# Patient Record
Sex: Male | Born: 1985 | Race: White | Hispanic: No | Marital: Single | Smoking: Former smoker
Health system: Southern US, Community
[De-identification: ages and names within clinical notes are randomized; demographics above are authoritative.]

## PROBLEM LIST (undated history)

## (undated) DIAGNOSIS — K219 Gastro-esophageal reflux disease without esophagitis: Secondary | ICD-10-CM

## (undated) DIAGNOSIS — F32A Depression, unspecified: Secondary | ICD-10-CM

## (undated) HISTORY — PX: ESOPHAGOGASTRODUODENOSCOPY: SHX1529

## (undated) HISTORY — PX: VASECTOMY: SHX75

## (undated) HISTORY — DX: Depression, unspecified: F32.A

## (undated) HISTORY — DX: Gastro-esophageal reflux disease without esophagitis: K21.9

---

## 2006-01-18 ENCOUNTER — Ambulatory Visit: Payer: Self-pay | Admitting: Specialist

## 2006-10-07 ENCOUNTER — Emergency Department: Payer: Self-pay | Admitting: General Practice
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
## 2009-05-13 ENCOUNTER — Emergency Department: Payer: Self-pay | Admitting: Emergency Medicine
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
## 2011-09-08 ENCOUNTER — Ambulatory Visit: Payer: Self-pay | Admitting: Urology

## 2011-09-08 IMAGING — US US RENAL KIDNEY
1 series · 17 of 25 positions shown · non-contrast
Comparison: none

REASON FOR EXAM: back pain
COMMENTS:

[Series 1: us renal kidney · 17 of 34 slices shown]
[im 1/34]
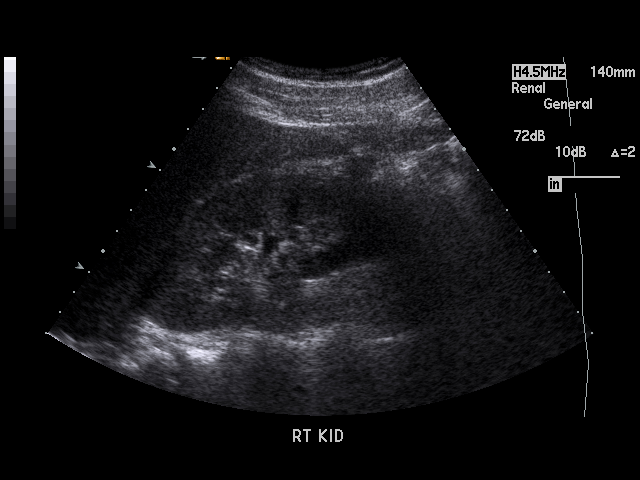
[im 3/34]
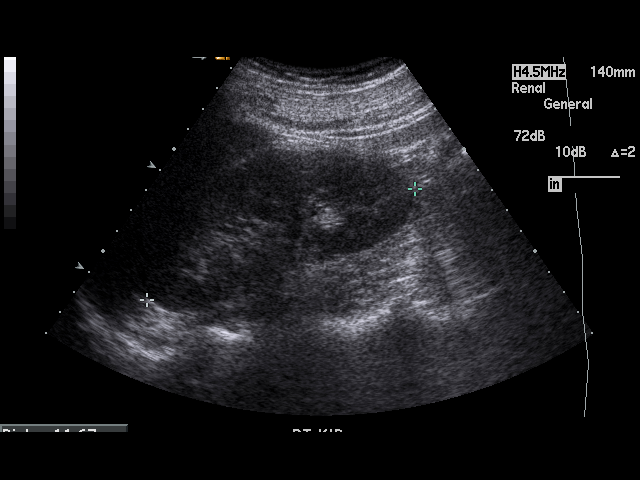
[im 5/34]
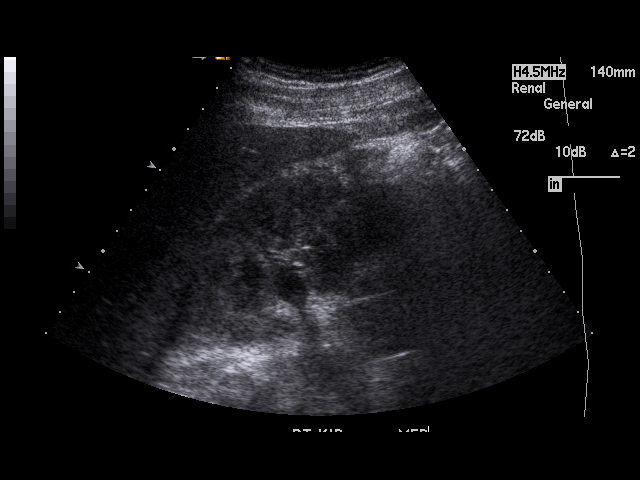
[im 7/34]
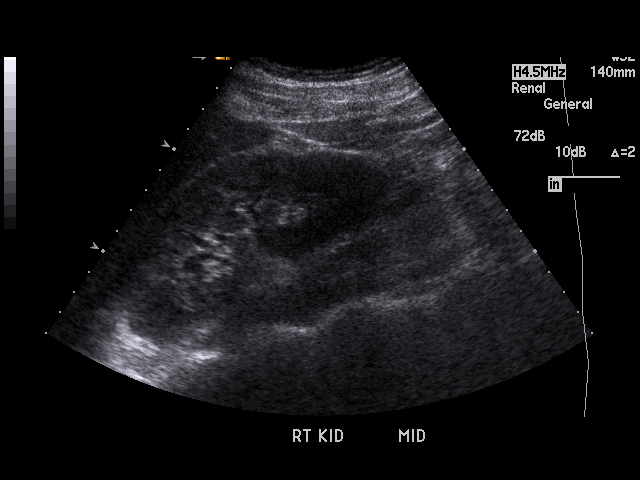
[im 9/34]
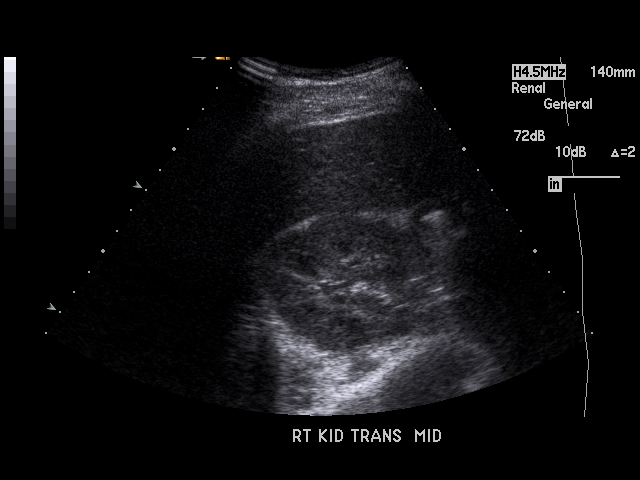
[im 12/34]
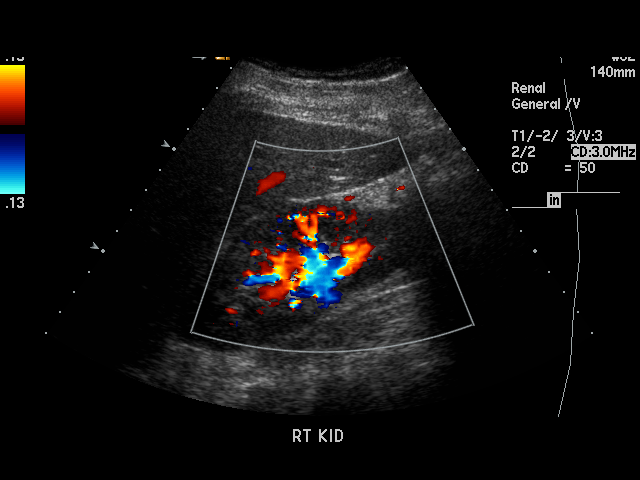
[im 13/34]
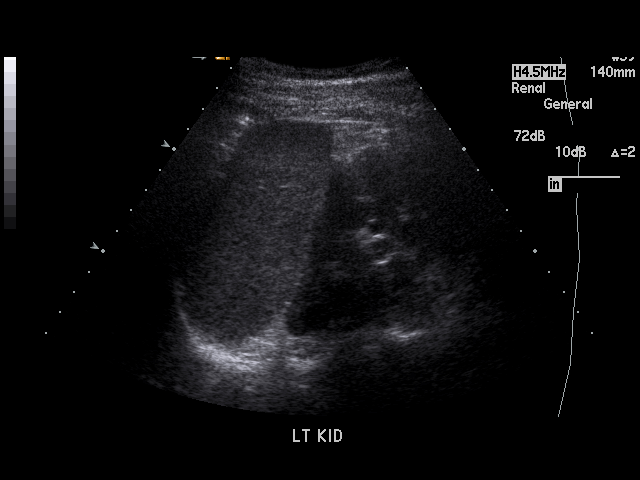
[im 16/34]
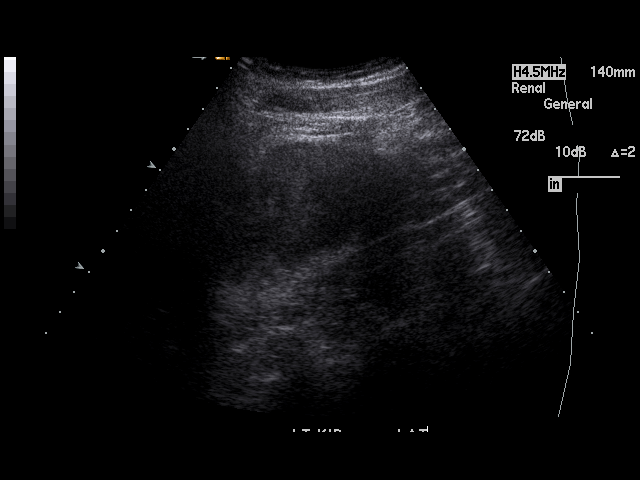
[im 17/34]
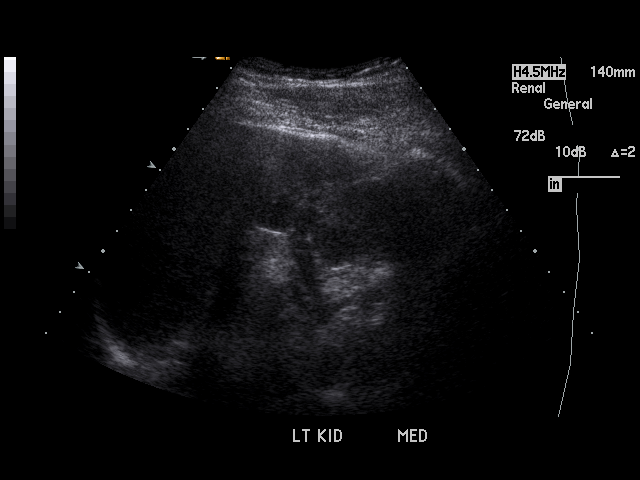
[im 18/34]
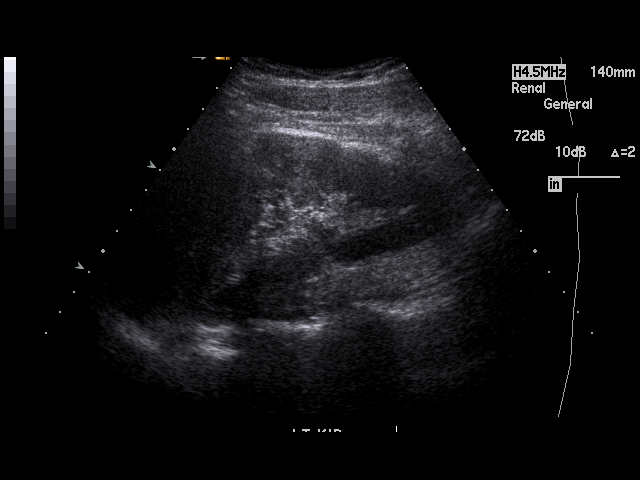
[im 21/34]
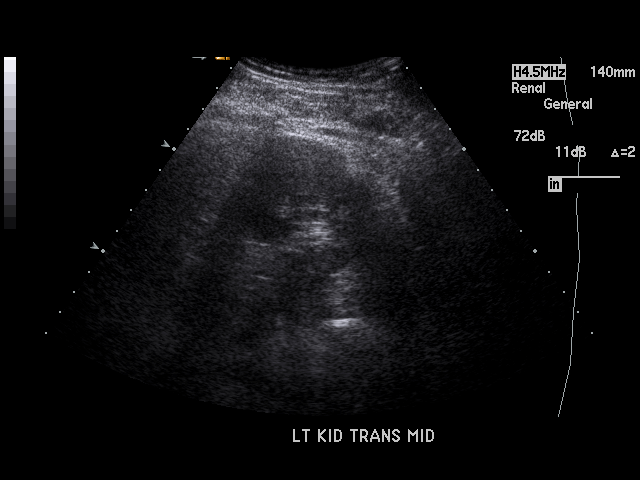
[im 23/34]
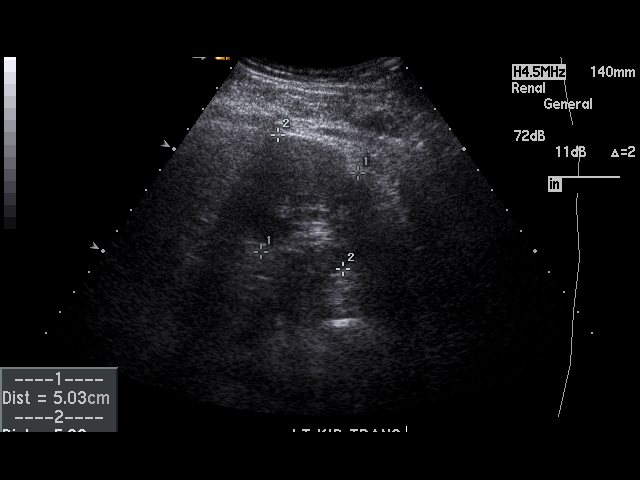
[im 25/34]
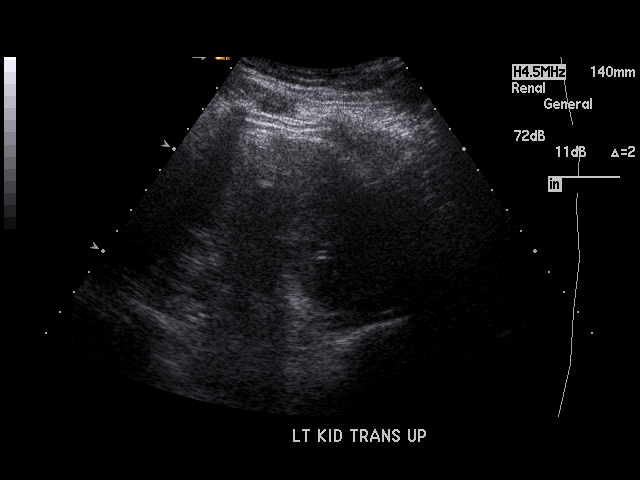
[im 27/34]
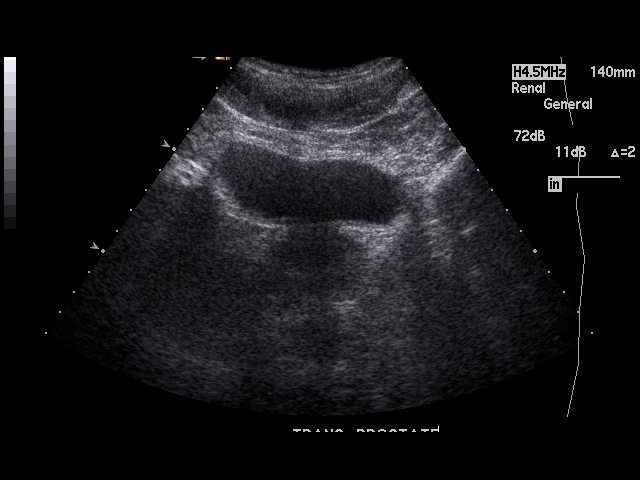
[im 29/34]
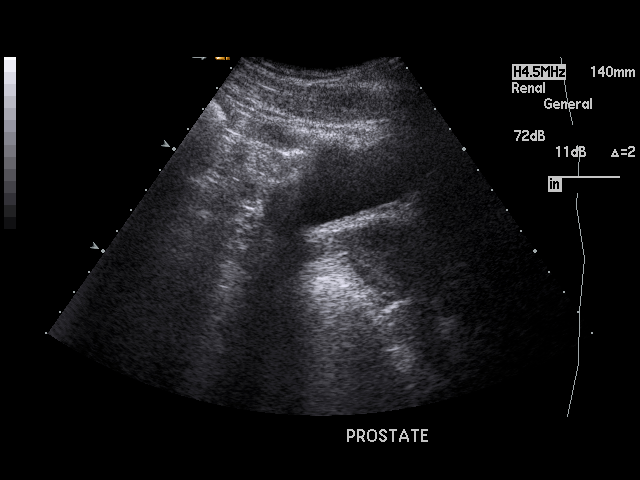
[im 31/34]
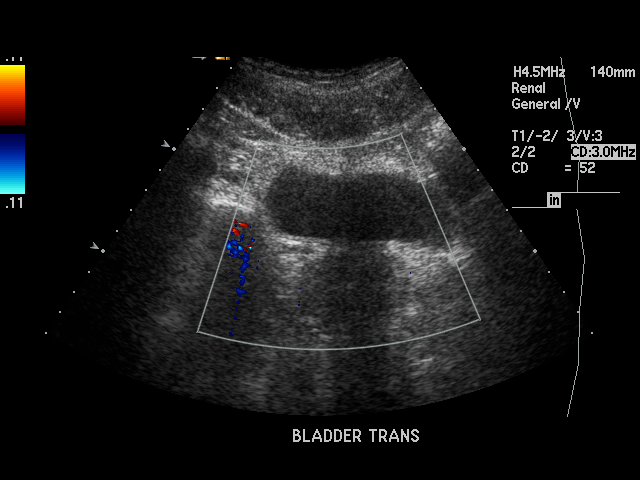
[im 34/34]
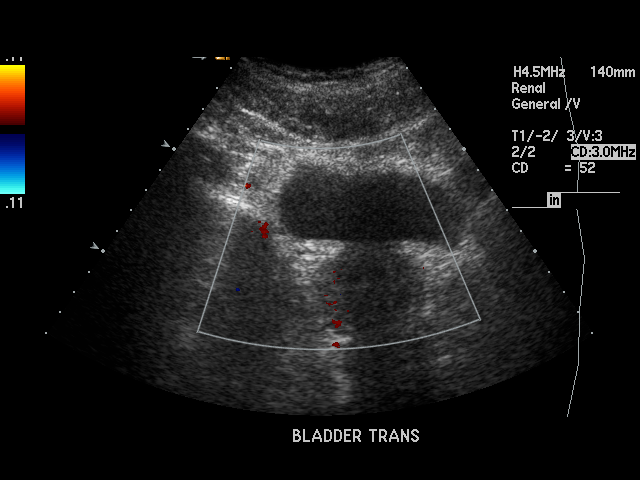

[17 of 25 positions shown; findings below may reference images not displayed]

PROCEDURE:     KAI TAI - KAI TAI KIDNEYS  - [DATE] [DATE]

RESULT:     Ultrasound demonstrates the right kidney measures 11.67 x 6.94 x
5.76 cm. The left kidney measures 11.60 x 5.03 x 5.99 cm. The prostate
measures 4.22 x 4.40 x 3.33 cm. The urinary bladder is nondistended. The
ureteral jets into the bladder could not be seen.
IMPRESSION: Unremarkable appearance of the kidneys.

## 2011-12-16 ENCOUNTER — Observation Stay: Payer: Self-pay | Admitting: Internal Medicine

## 2011-12-16 LAB — URINALYSIS, COMPLETE
Glucose,UR: NEGATIVE mg/dL (ref 0–75)
Ketone: NEGATIVE
Leukocyte Esterase: NEGATIVE
Ph: 6 (ref 4.5–8.0)
Protein: NEGATIVE
Specific Gravity: 1.002 (ref 1.003–1.030)

## 2011-12-16 LAB — BASIC METABOLIC PANEL
Anion Gap: 8 (ref 7–16)
BUN: 9 mg/dL (ref 7–18)
Chloride: 114 mmol/L — ABNORMAL HIGH (ref 98–107)
Co2: 22 mmol/L (ref 21–32)
Creatinine: 0.75 mg/dL (ref 0.60–1.30)
EGFR (African American): >60
Glucose: 111 mg/dL — ABNORMAL HIGH (ref 65–99)
Sodium: 144 mmol/L (ref 136–145)

## 2011-12-16 LAB — DRUG SCREEN, URINE
Cannabinoid 50 Ng, Ur ~~LOC~~: NEGATIVE (ref ?–50)
Cocaine Metabolite,Ur ~~LOC~~: NEGATIVE (ref ?–300)
Methadone, Ur Screen: NEGATIVE (ref ?–300)
Opiate, Ur Screen: NEGATIVE (ref ?–300)
Phencyclidine (PCP) Ur S: NEGATIVE (ref ?–25)

## 2011-12-16 LAB — COMPREHENSIVE METABOLIC PANEL
Albumin: 4.3 g/dL (ref 3.4–5.0)
Alkaline Phosphatase: 80 U/L (ref 50–136)
BUN: 12 mg/dL (ref 7–18)
Calcium, Total: 8.6 mg/dL (ref 8.5–10.1)
Chloride: 104 mmol/L (ref 98–107)
Co2: 24 mmol/L (ref 21–32)
Glucose: 94 mg/dL (ref 65–99)
Osmolality: 277 (ref 275–301)
SGOT(AST): 18 U/L (ref 15–37)

## 2011-12-16 LAB — CBC
HCT: 42.7 % (ref 40.0–52.0)
HGB: 15 g/dL (ref 13.0–18.0)
MCH: 31 pg (ref 26.0–34.0)
MCHC: 35.2 g/dL (ref 32.0–36.0)
MCV: 88 fL (ref 80–100)
RBC: 4.85 10*6/uL (ref 4.40–5.90)
RDW: 12.3 % (ref 11.5–14.5)
WBC: 8.6 10*3/uL (ref 3.8–10.6)

## 2011-12-16 LAB — SALICYLATE LEVEL: Salicylates, Serum: <1.7 mg/dL

## 2011-12-16 LAB — TSH: Thyroid Stimulating Horm: 1.82 u[IU]/mL

## 2011-12-16 LAB — ETHANOL: Ethanol: 154 mg/dL

## 2011-12-16 LAB — ACETAMINOPHEN LEVEL: Acetaminophen: <2 ug/mL

## 2011-12-16 LAB — PROTIME-INR: Prothrombin Time: 13.7 secs (ref 11.5–14.7)
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
## 2011-12-20 ENCOUNTER — Inpatient Hospital Stay: Payer: Self-pay | Admitting: Psychiatry

## 2011-12-20 LAB — COMPREHENSIVE METABOLIC PANEL
Albumin: 4.4 g/dL (ref 3.4–5.0)
Alkaline Phosphatase: 81 U/L (ref 50–136)
BUN: 11 mg/dL (ref 7–18)
Calcium, Total: 8.9 mg/dL (ref 8.5–10.1)
Creatinine: 0.88 mg/dL (ref 0.60–1.30)
EGFR (African American): >60
Glucose: 83 mg/dL (ref 65–99)
Osmolality: 278 (ref 275–301)
Potassium: 3.7 mmol/L (ref 3.5–5.1)
Sodium: 140 mmol/L (ref 136–145)
Total Protein: 7.9 g/dL (ref 6.4–8.2)

## 2011-12-20 LAB — CBC
HCT: 46.6 % (ref 40.0–52.0)
MCH: 30.5 pg (ref 26.0–34.0)
MCV: 88 fL (ref 80–100)
Platelet: 246 10*3/uL (ref 150–440)
RDW: 12.2 % (ref 11.5–14.5)
WBC: 7.6 10*3/uL (ref 3.8–10.6)

## 2011-12-20 LAB — DRUG SCREEN, URINE
Amphetamines, Ur Screen: NEGATIVE (ref ?–1000)
Barbiturates, Ur Screen: NEGATIVE (ref ?–200)
MDMA (Ecstasy)Ur Screen: NEGATIVE (ref ?–500)
Methadone, Ur Screen: NEGATIVE (ref ?–300)
Opiate, Ur Screen: POSITIVE (ref ?–300)
Phencyclidine (PCP) Ur S: NEGATIVE (ref ?–25)
Tricyclic, Ur Screen: NEGATIVE (ref ?–1000)

## 2011-12-20 LAB — URINALYSIS, COMPLETE
Bacteria: NONE SEEN
Bilirubin,UR: NEGATIVE
Blood: NEGATIVE
Ketone: NEGATIVE
Leukocyte Esterase: NEGATIVE
Nitrite: NEGATIVE
Ph: 5 (ref 4.5–8.0)
Protein: NEGATIVE
Specific Gravity: 1.012 (ref 1.003–1.030)
Squamous Epithelial: <1
WBC UR: 1 /HPF (ref 0–5)

## 2011-12-20 LAB — TSH: Thyroid Stimulating Horm: 0.587 u[IU]/mL

## 2011-12-23 LAB — FOLATE: Folic Acid: 13.2 ng/mL (ref 3.1–100.0)

## 2011-12-23 LAB — LIPID PANEL
Cholesterol: 158 mg/dL (ref 0–200)
HDL Cholesterol: 37 mg/dL — ABNORMAL LOW (ref 40–60)
Ldl Cholesterol, Calc: 93 mg/dL (ref 0–100)

## 2012-08-14 DIAGNOSIS — N419 Inflammatory disease of prostate, unspecified: Secondary | ICD-10-CM

## 2012-08-14 HISTORY — DX: Inflammatory disease of prostate, unspecified: N41.9

## 2014-03-28 ENCOUNTER — Emergency Department: Payer: Self-pay | Admitting: General Practice

## 2014-12-06 NOTE — H&P (Signed)
PATIENT NAME:  Danny Russell, MUSIAL MR#:  [id number] DATE OF BIRTH:  Jul 08, 1986  DATE OF ADMISSION:  12/16/2011  PRIMARY CARE PHYSICIAN: Dr. Debbora Dus.   CHIEF COMPLAINT: Drug overdose.   HISTORY OF PRESENT ILLNESS: Danny Russell is a 29 year old Caucasian male with a history of anxiety disorder, was brought by Police to the Emergency Department for evaluation of a drug overdose. Initially, he was combative and had to be handcuffed; but right now he is in deep sleep and not arousable. The story is that his girlfriend called that he took a drug overdose. With the patient were bottles of medications that belong to somebody else, and these contain the following medications: Gemfibrozil 600 mg tablets, Effexor XR 150 mg, clonazepam 1 mg tablets, metformin 1000 mg tablets, Cialis 20 mg, pravastatin 40 mg, Centrum Silver and tizanidine 4 mg. We do not have information of whether he took all these medications, or some, and how much he took. [facility] was called, and they recommended monitoring the patient and watching him in the hospital.   REVIEW OF SYSTEMS: A 10-point system review is unobtainable due to the patient's unresponsiveness at this point.   PAST MEDICAL HISTORY: According to his mother, he has history of anxiety for which he takes Cymbalta. In the last few months he had prostatitis treated with a few rounds of antibiotics, but currently he is not taking treatment for that.   FAMILY HISTORY: His grandfather suffered from coronary artery disease and diabetes mellitus. According to the mother, there is no other significant family history.   SOCIAL HABITS: Chronic smoker. Amount of smoking is unknown. He drinks beer over weekends, sometimes mixed with alcohol.   SOCIAL HISTORY: The patient lives with his girlfriend. He has one year of college education. 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 The  type of reaction is unknown. He also has side effects from taking Augmentin and Biaxin. Those two antibiotics cause abdominal pain.   PHYSICAL EXAMINATION:  VITAL SIGNS: Blood pressure 92/58, respiratory rate 22, pulse 83, temperature 96.7, oxygen saturation 94%.   GENERAL APPEARANCE: A young male laying in bed in no acute distress. He is in deep sleep.   HEENT: Head: No pallor. No icterus. No cyanosis. Ears, nose and throat:  Exam is limited due to the patient's unresponsiveness. The mouth looks clean. Eyes: Examination revealed normal eyelids and conjunctivae. Pupils are about 4 to 5 mm, very sluggishly reactive to light.   NECK: Supple. Trachea at midline. No thyromegaly. No cervical lymphadenopathy. No masses.   HEART: Exam revealed normal S1, S2. No S3, S4. No murmur. No gallop. No carotid bruits.   RESPIRATORY: Exam revealed normal breathing pattern without use of accessory muscles. No rales. No wheezing.   ABDOMEN: Soft without tenderness. No hepatosplenomegaly. No masses. No rigidity. No hernias.   SKIN: Exam revealed no ulcers, no subcutaneous nodules, but he has multiple tattoos on different parts of his body.   MUSCULOSKELETAL: No joint swelling. No clubbing.   NEUROLOGIC: The patient earlier before I examined him was combative, but now he is sleeping, not arousable; only when they tried to put the NG tube in he was resisting, but thereafter they placed a Foley catheter without much of reaction from the patient. There is no facial asymmetry.   PSYCHIATRIC: Evaluation is unobtainable due to the patient's unresponsiveness at this point.    LABORATORY, DIAGNOSTIC AND  RADIOLOGICAL DATA:  Blood work-up showed glucose of 94, BUN 12, creatinine 0.8, sodium 139, potassium 3.5. Alcohol level was 154. Ethanol percentage was 0.154.  Liver function tests were normal.  Troponin was less than 0.02. TSH was 1.8.  Drug screen was negative for amphetamines, barbiturates, benzodiazepines,  cocaine, cannabinoids, opiates, methadone, phencyclidine, cyclic-all these were negative.  CBC showed normal white count of 8000, hemoglobin 15, hematocrit 42, platelet count 264. Urinalysis was negative.  Acetaminophen level less than 2, salicylate less than 1.7.  ABG showed pH of 7.30, pCO2 46, pO2 84. Base excess showed -4.   ASSESSMENT:  1. Drug overdose, apparently in suicidal attempt. Medications that he ingested are as above, and we are unsure how many he took and if he took all.  2. Suicidal attempt. 3. Mixed metabolic and respiratory acidosis.   PLAN:  1. Admit the patient to the Intensive Care Unit.  2. Start IV hydration.  3. Check basic metabolic profile in two hours from now. Monitor blood sugar for the next few hours. If there is further electrolyte abnormalities or a change in the acid base balance, we may need to repeat arterial blood gas in a few hours.  4. Psychiatric consultation.  5. Watch for metabolic acidosis due to CO2 underlying metformin ingestion.   I interviewed his mother. She did not have much information other than what I stated above.   TIME SPENT:   Time spent evaluating this patient took more than 55 minutes.   ____________________________ Clovis Pu. Lenore Manner, MD amd:cbb D: 12/16/2011 06:53:29 ET T: 12/16/2011 08:33:21 ET JOB#: [id number] cc: Clovis Pu. Lenore Manner, MD, <Dictator> Milinda Pointer. Jacqualine Code, MD Mike Craze Irven Coe MD ELECTRONICALLY SIGNED 12/16/2011 22:20

## 2014-12-06 NOTE — H&P (Signed)
PATIENT NAME:  Danny Russell, Danny Russell MR#:  [id number] DATE OF BIRTH:  1986-03-19  DATE OF ADMISSION:  12/20/2011  REFERRING PHYSICIAN: Graciella Freer, MD  ADMITTING PHYSICIAN: Cephus Shelling, MD  REASON FOR ADMISSION: Suicidal thoughts.   IDENTIFYING INFORMATION: Danny Russell is a 29 year old single Caucasian male currently living in the [place] area with a male friend. He has two children with his ex-girlfriend. He has never been married. 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 Kasandra Knudsen at [facility] who came to the emergency room under an involuntary commitment after sending a text message to his girlfriend indicating that he planned to kill himself. The patient does report that he has been having suicidal thoughts over the past one week as well as worsening depressive symptoms over the past 3 to 4 months since breaking up with his girlfriend. The patient says that his girlfriend has been a little resistant in allowing him to see the two children that the share even though he claims that he sees them every weekend and also several times a week in daycare. The two share a 31 year old and a 54 year old child. The patient recently overdosed on Klonopin on 12/15/2001 in an intentional suicide attempt and was admitted to the medicine service. He was seen by psychiatry and discharged that day. The patient is also struggling with alcohol abuse. He is drinking 12 to 16 beers three to four times a week and says that when he sent the text messages he was intoxicated. He also reports sometimes abusing pain pills when he is drinking. He denies any recent marijuana or cocaine use. Toxicology screen in the emergency room was positive for opioids but negative for all other substances. Ethanol level was 19. He is reporting  decreased energy over the past one week, difficulty with focus and concentration, insomnia, and feelings of hopelessness. He denies any crying spells or change in appetite. He denies any psychotic symptoms such as auditory or visual hallucinations. He denies any paranoid thoughts or delusions. The patient denies any history of symptoms consistent with bipolar mania including grandiose delusions, hyperreligious thoughts, hypersexual behavior, or impulsive behavior such as spending sprees or gambling. The patient says that he and his girlfriend broke up because of his drinking as well as him being unfaithful with several different women. The patient says that he wants to be able to work things out with his girlfriend and has stopped seeing other women. He denies any other significant recent stressors contributing to worsening depression. The patient is currently on Cymbalta 30 mg p.o. daily and dose the has been decreased from 60 mg p.o. daily to 30 mg p.o. daily in the summer of 2012 because he said he was feeling too sleepy with the 60 mg of Cymbalta. He has failed trials of multiple other psychotropic medications including Paxil, Celexa, Lexapro, BuSpar, and Pristiq. He had had a positive response to Effexor in the past but then he stopped the medication. When he restarted it, he said it was making him feel very funny and he was taken off of it. He has never had a trial of Abilify in the past.   PAST PSYCHIATRIC HISTORY: The patient does have a history of overdosing just this past week, on 12/16/2011, on Klonopin in an intentional suicide  attempt. He denies any prior inpatient psychiatric hospitalization. He has been seeing Dr. Kasandra Knudsen at [facility] for outpatient psychotropic medication management for the past five years. He does have a history of one DUI and court-ordered substance abuse treatment in the past as well, in 2010. Past psychotropic medication trials are listed in the History of Present  Illness.   SUBSTANCE ABUSE HISTORY: The patient reports drinking 12 to 16 beers three to four times a week and says that in the past, several years ago, he did have a history of daily alcohol use for a brief period of time. He also reports a history of cocaine and cannabis abuse but says he has not used marijuana in six years and has not used cocaine since the age of 7. He does have a history of abusing Percocets over the past two to three years on the weekends when he drinks. He also smokes one pack of cigarettes per day and dips tobacco daily.   FAMILY PSYCHIATRIC HISTORY: He denies any history of any mental illness or substance use in the family.   PAST MEDICAL HISTORY:  1. Gastroesophageal reflux disease. 2. Recent history of prostatitis. The patient had an antibiotic for prostatitis several weeks ago, treated by Dr. Bernardo Heater.  3. He denies any history of any prior surgeries. He denies any history of any prior TBI or alcohol withdrawal seizures.   OUTPATIENT MEDICATIONS:  1. Cymbalta 30 mg p.o. daily. 2. Zegerid one capsule p.o. daily.   ALLERGIES: Sudafed, Biaxin, and Augmentin.   SOCIAL HISTORY: The patient was born and raised in the [place] area by both his biological parents. He says both his parents are currently living together in the [place] area. He denies any history of any physical or sexual abuse. 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 He lives with a male friend in a house in [place]. Again, he has two children ages 37 and 68 with ex-girlfriend. They broke up at Thanksgiving of 2012.   LEGAL HISTORY: History of one DUI and court-ordered substance abuse treatment in 2010. He does report a court date tomorrow for speeding ticket.   MENTAL STATUS EXAM: Danny Russell is a 29 year old single Caucasian male who is wearing burgundy scrub pants and a lime green shirt. He was  fully alert and oriented to time, place, and situation. Speech was regular rate and rhythm, fluent and coherent. Mood was described as being "okay" but affect was depressed. Thought processes were linear, logical, and goal-directed. He denied any current active suicidal thoughts but did endorse some passive suicidal thoughts over this past one week. He denied any homicidal thoughts or psychotic symptoms including auditory or visual hallucinations. No paranoid thoughts or delusions. Attention and concentration were fairly good. Judgment and insight were fairly good. Recall was three out of three initially and three out of three after 5 minutes. He could spell world backwards correctly and do serial sevens to 79. The patient named the presidents backwards as Obama, Carnuel, and Clinton. Abstraction was good.   SUICIDE RISK ASSESSMENT: Due to recent overdose as well as having written a suicide note he remains at a moderately elevated risk of harm to self and others. He is willing to be open to psychotropic medication changes including a trial of Abilify. He does appear to have a good support system from his parents. He does report that  the guns were taken out of his house and they are in his mom's possession. His mom confirmed this.  REVIEW OF SYSTEMS: CONSTITUTIONAL: He denies any weakness, fatigue or weight changes. He denies any fever, chills, or night sweats. HEAD: He denies headaches or dizziness. EYES: He denies any diplopia or blurred vision. ENT: He denies any hearing loss, neck pain or difficulty swallowing. RESPIRATORY: He denies any shortness breath or cough. CARDIOVASCULAR: He denies any chest pain or orthopnea. GASTROINTESTINAL: He denies any nausea, vomiting, abdominal pain, diarrhea, or constipation. GENITOURINARY: He denies incontinence or problems with frequency of urine. ENDOCRINE: He denies any heat or cold intolerance. LYMPHATIC: He denies anemia or easy bruising. MUSCULOSKELETAL: He denies any  muscle or joint pain. NEUROLOGIC: He denies any tingling or weakness. PSYCHIATRIC: Please see history of present illness.   PHYSICAL EXAMINATION:   VITAL SIGNS: Blood pressure 142/67, heart rate 82, respirations 18, temperature 96.7, and pulse oximetry 98% on room air.   HEENT: Normocephalic, atraumatic. Pupils equal, round, and reactive to light and accommodation. Extraocular movements intact. Oral mucosa was moist. No lesions noted.   NECK: Supple. No cervical lymphadenopathy or thyromegaly present.   LUNGS: Clear to auscultation bilaterally. No crackles, rales, or rhonchi.   CARDIAC: S1 and S2 present, regular rate and rhythm. No murmurs, rubs, or gallops.   ABDOMEN: Soft and normoactive bowel sounds present in all four quadrants. No tenderness noted.   EXTREMITIES: The patient did have tattoos bilaterally in his upper extremities. No rashes, clubbing, or edema.   NEUROLOGIC: Cranial nerves II through XII are grossly intact. No tremor noted. No hypo or hyperreflexia noted. Gait was normal and steady.   LABS/STUDIES: Toxicology screen is positive for opioids, but negative for all other substances. Ethanol level was 19. BMP, LFTs, TSH, and CBC were all within normal limits. Urinalysis was nitrite and leukocyte esterase negative with 1 WBC, no bacteria. Acetaminophen and salicylate levels were unremarkable.   EKG on 12/16/2011 showed a QTc interval of 443 and a ventricular rate of 82.  DIAGNOSES:   AXIS I:  1. Major depression, single episode, without psychosis. 2. Generalized anxiety disorder. 3. Opioid abuse. 4. History of cocaine and cannabis abuse, in full remission. 5. Alcohol dependence.   AXIS II: Deferred.   AXIS III: Gastroesophageal reflux disease.  AXIS IV: Mild to moderate - relationship conflict.   AXIS V: Global assessment of functioning score at present equals 30.   ASSESSMENT AND TREATMENT RECOMMENDATIONS: Mr. Laubacher is a 29 year old single Caucasian male with  a history of GAD who now presents with a 4 to 5 month history of depressive symptoms worsened in the past one week. He has had a recent overdose on Klonopin just this past weekend and has recently been sending text messages to his girlfriend indicating his wish to die. We will plan to admit to inpatient psychiatry for medication management, safety, and stabilization and place on suicide precautions and close observation.  1. Major depression, single episode, without psychotic features and GAD: We will keep Cymbalta at 30 mg p.o. daily as the patient is not interested in trying to increase the medication again. We will add Abilify 2 mg p.o. daily as an adjunct for depression and trazodone 100 mg p.o. nightly for insomnia. We will check EKG to rule out QTc prolongation and we will check a lipid panel in the a.m. as well as T70 and folic.  2. Gastroesophageal reflux disease: We will plan to restart omeprazole.  3. Alcohol dependence and  history of cocaine cannabis abuse and opioid abuse: We will plan to start the patient on Ativan per CIWA as well as give multivitamin, thiamine, and folic acid. We will encourage the patient to enter a meaningful recovery program. At this time, he is opposed to residential substance abuse treatment due to work obligations.  4. We will try to have a family meeting with the patient's girlfriend.  5. Disposition: The patient says he has a stable living situation. Mental health follow-up will be with Dr. Kasandra Knudsen at [facility]. The risks, benefits, and alternative treatments were discussed with the patient. He will remain under an IVC at this time. ____________________________ Steva Colder. Nicolasa Ducking, MD akk:slb D: 12/20/2011 19:48:35 ET     T: 12/21/2011 07:54:08 ET       JOB#: [id number] cc: Leron Stoffers K. Nicolasa Ducking, MD, <Dictator> Chauncey Mann MD ELECTRONICALLY SIGNED 12/21/2011 10:00

## 2014-12-06 NOTE — Discharge Summary (Signed)
PATIENT NAME:  Danny Russell, Danny Russell MR#:  [id number] DATE OF BIRTH:  1985-12-23  DATE OF ADMISSION:  12/16/2011 DATE OF DISCHARGE:  12/16/2011  PRIMARY CARE PHYSICIAN: Debbora Dus, MD  PSYCHIATRIST: Myer Haff, MD  FINAL DIAGNOSES:  1. Encephalopathy.  2. Drug overdose.  3. Alcohol abuse.  4. Tobacco abuse.   DISCHARGE MEDICATIONS:  Cymbalta 30 mg daily.   DISCHARGE INSTRUCTIONS: He was advised to not drink alcohol and advised to not smoke cigarettes.   DIET: Low sodium diet.   ACTIVITY: Activity as tolerated.   DISCHARGE FOLLOWUP: Follow-up with Dr. Kasandra Knudsen one week after discharge.  REASON FOR ADMISSION: The patient was admitted 12/16/2011 and discharged on the same day. Actually he did tell the admitting physician that his primary care physician was Dr. Debbora Dus. He was brought in with drug overdose.  HISTORY OF PRESENT ILLNESS: This is a 29 year old man with anxiety brought in by police to the emergency department for evaluation of drug overdose. Initially he was combative and had to be handcuffed. Now he was in a deep sleep and not arousable. His girlfriend called saying that he took a drug overdose. The patient admits to taking clonazepam, does not know the dosage and how many he took. The patient was admitted to the Intensive Care Unit and initially started on IV fluids.   LABS/STUDIES: Urine toxicology that was negative.   Urinalysis was negative.   TSH 1.82. Troponin negative. Salicylates less than 1.7. INR 1.0. White blood cell count 8.6, hemoglobin and hematocrit 15.0 and 42.7 and platelet count 264. Ethanol level 154. Glucose 94, BUN 12, creatinine 0.88, sodium 139, potassium 3.5, chloride 104, CO2 24, and calcium 8.6. Liver function tests normal. Acetaminophen less than 2.   ABG showed a pH of 7.3, pCO2 of 46, pO2 84, bicarbonate 22.6, and oxygen saturation of 97.7.   Chest x-ray: No acute cardiopulmonary disease.   CT scan of the head showed no acute intracranial  abnormality.  HOSPITAL COURSE PER PROBLEM LIST:  1. When I saw the patient in the afternoon on 12/16/2011 His encephalopathy had cleared. He was answering all questions appropriately. At this point, He did not need to be on the medical floor. He initially did not want to answer my questions and I did not feel comfortable sending him home without a psychiatric evaluation.  2. For his drug overdose, he told me he took Klonopin, but could not quantify how much or the strength. The patient was seen in consultation by Dr. Camie Patience, psychiatry, who believed this was not a suicide attempt and he had no suicidal ideation and was stable for discharge home with close clinical follow-up with Dr. Kasandra Knudsen, psychiatry, as an outpatient.  3. For his alcohol abuse, the patient is not willing to stop his alcohol drinking. He will continue to drink. He is not interested in stopping or detox at this time. He did not tell me how much he does drink, but he said he does get shaky if he stops drinking.  4. For his tobacco abuse, the patient was given a nicotine patch here and counseled three minutes on smoking cessation. The patient does not want to stop smoking at this time.  5. For his acidosis, most likely secondary to overdose.          DISPOSITION: The patient was discharged home in stable medical condition and close clinical follow-up with a psychiatrist is needed as an outpatient, Dr. Kasandra Knudsen. Dr. Camie Patience deemed him stable from a psychiatric  standpoint and was not a suicide risk.  TIME SPENT ON DISCHARGE: 40 minutes.  ____________________________ Tana Conch. Leslye Peer, MD rjw:slb D: 12/17/2011 15:23:04 ET T: 12/18/2011 10:38:41 ET JOB#: [id number]  cc: Tana Conch. Leslye Peer, MD, <Dictator> Myer Haff, MD Milinda Pointer Jacqualine Code, Birdsong MD ELECTRONICALLY SIGNED 12/21/2011 17:06

## 2018-08-14 HISTORY — PX: WRIST SURGERY: SHX841

## 2020-02-28 ENCOUNTER — Emergency Department
Admission: EM | Admit: 2020-02-28 | Discharge: 2020-02-28 | Disposition: A | Discharge status: Home / Self Care | Payer: BC Managed Care – PPO | Attending: Emergency Medicine | Admitting: Emergency Medicine

## 2020-02-28 ENCOUNTER — Other Ambulatory Visit: Payer: Self-pay

## 2020-02-28 ENCOUNTER — Emergency Department: Payer: BC Managed Care – PPO

## 2020-02-28 DIAGNOSIS — M545 Low back pain, unspecified: Secondary | ICD-10-CM

## 2020-02-28 DIAGNOSIS — R3915 Urgency of urination: Secondary | ICD-10-CM | POA: Diagnosis not present

## 2020-02-28 DIAGNOSIS — Z87891 Personal history of nicotine dependence: Secondary | ICD-10-CM | POA: Diagnosis not present

## 2020-02-28 LAB — URINALYSIS, COMPLETE (UACMP) WITH MICROSCOPIC
Bacteria, UA: NONE SEEN
Bilirubin Urine: NEGATIVE
Glucose, UA: NEGATIVE mg/dL
Hgb urine dipstick: NEGATIVE
Ketones, ur: NEGATIVE mg/dL
Leukocytes,Ua: NEGATIVE
Nitrite: NEGATIVE
Protein, ur: NEGATIVE mg/dL
Specific Gravity, Urine: 1.027 (ref 1.005–1.030)
pH: 5 (ref 5.0–8.0)

## 2020-02-28 IMAGING — CR DG LUMBAR SPINE COMPLETE 4+V
1 series · 5 of 5 positions shown · non-contrast
Comparison: None.

CLINICAL DATA: Presents with lower back pain. States this pain has
been going on for several months, but states now he having some
lower abd pain

EXAM:
LUMBAR SPINE - COMPLETE 4+ VIEW

[Series 1: dg lumbar spine complete 4 +v · 0.14mm/px · 5 of 5 slices shown]
[im 1/5]
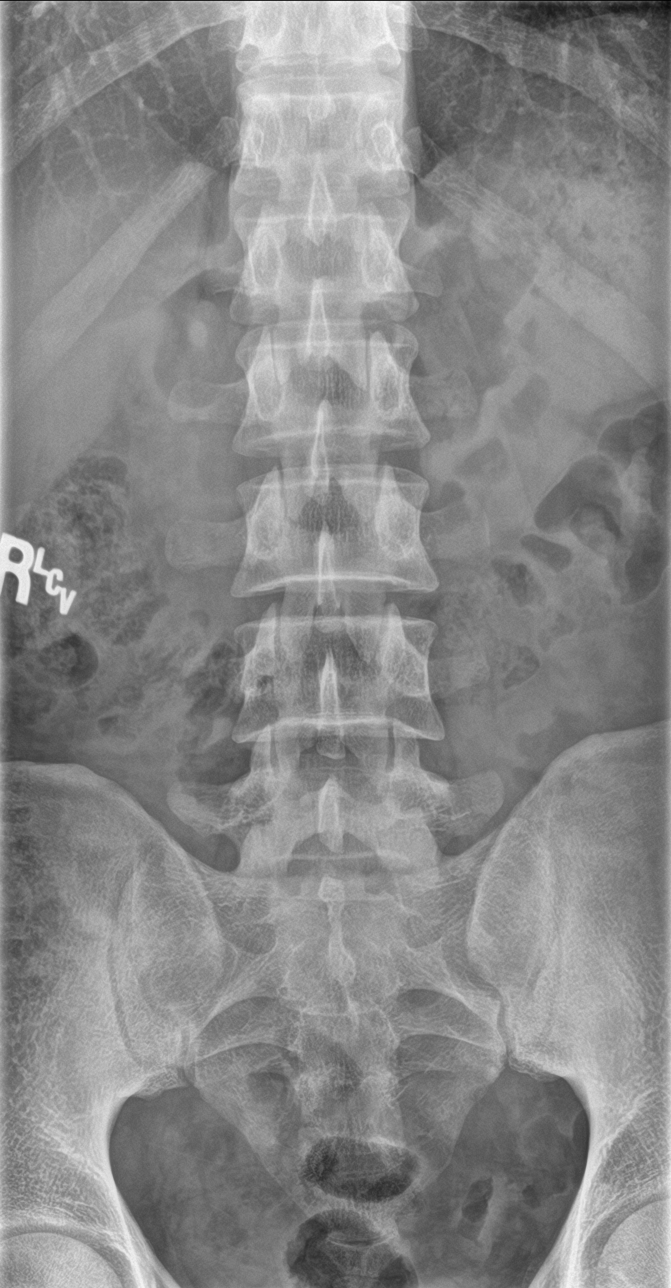
[im 2/5]
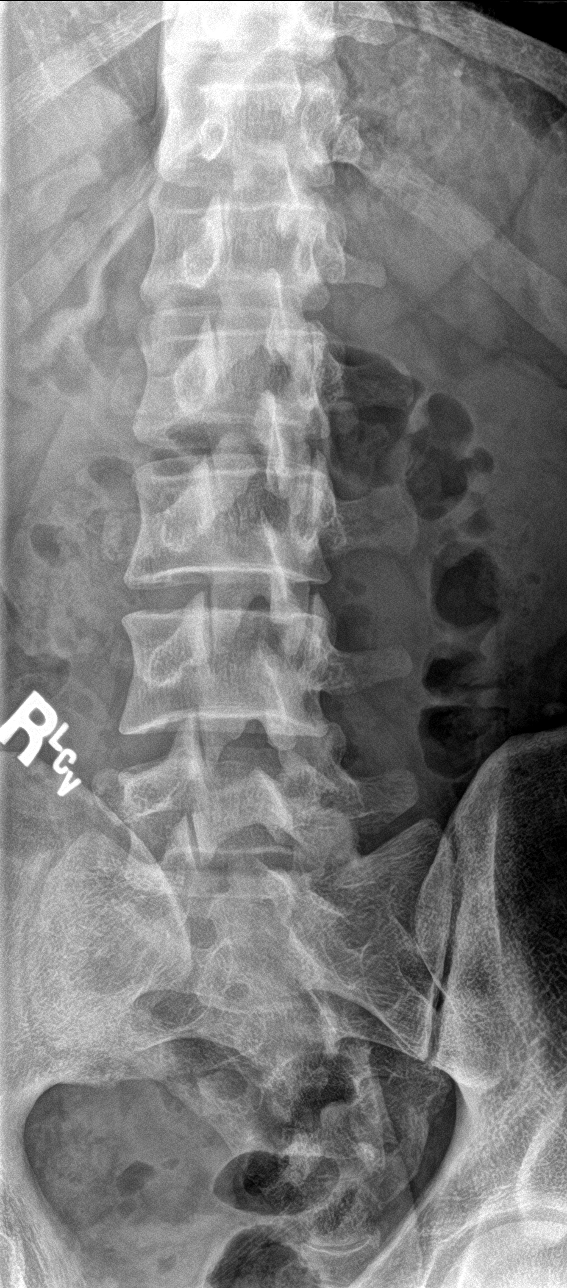
[im 3/5]
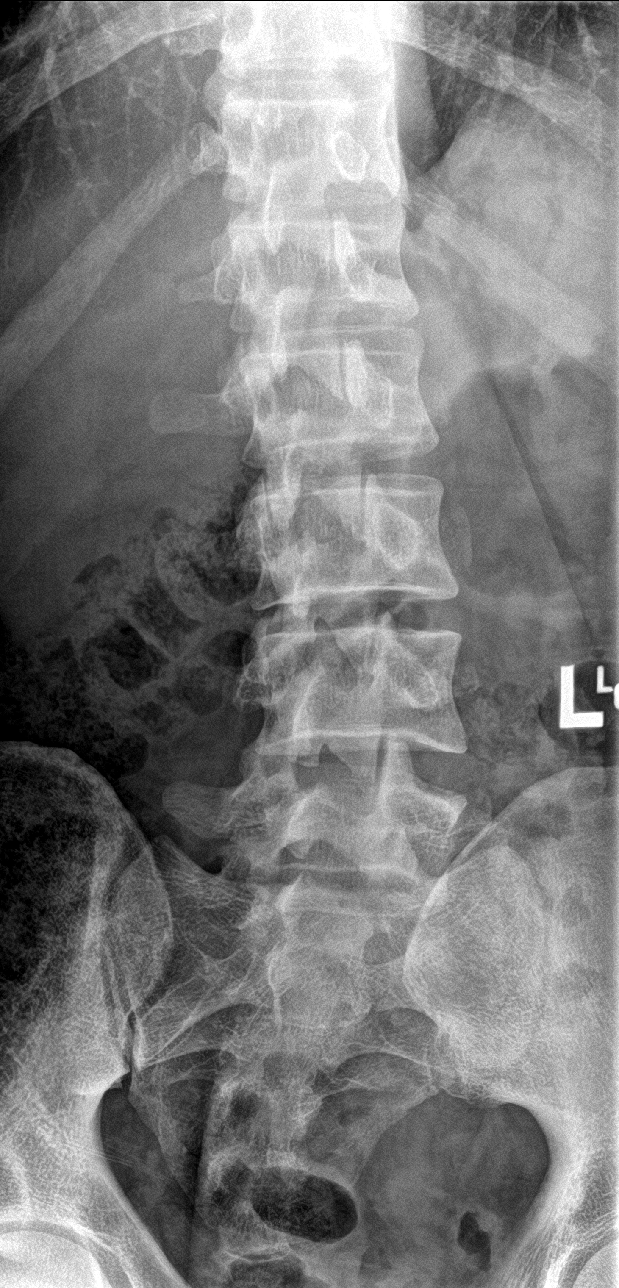
[im 4/5]
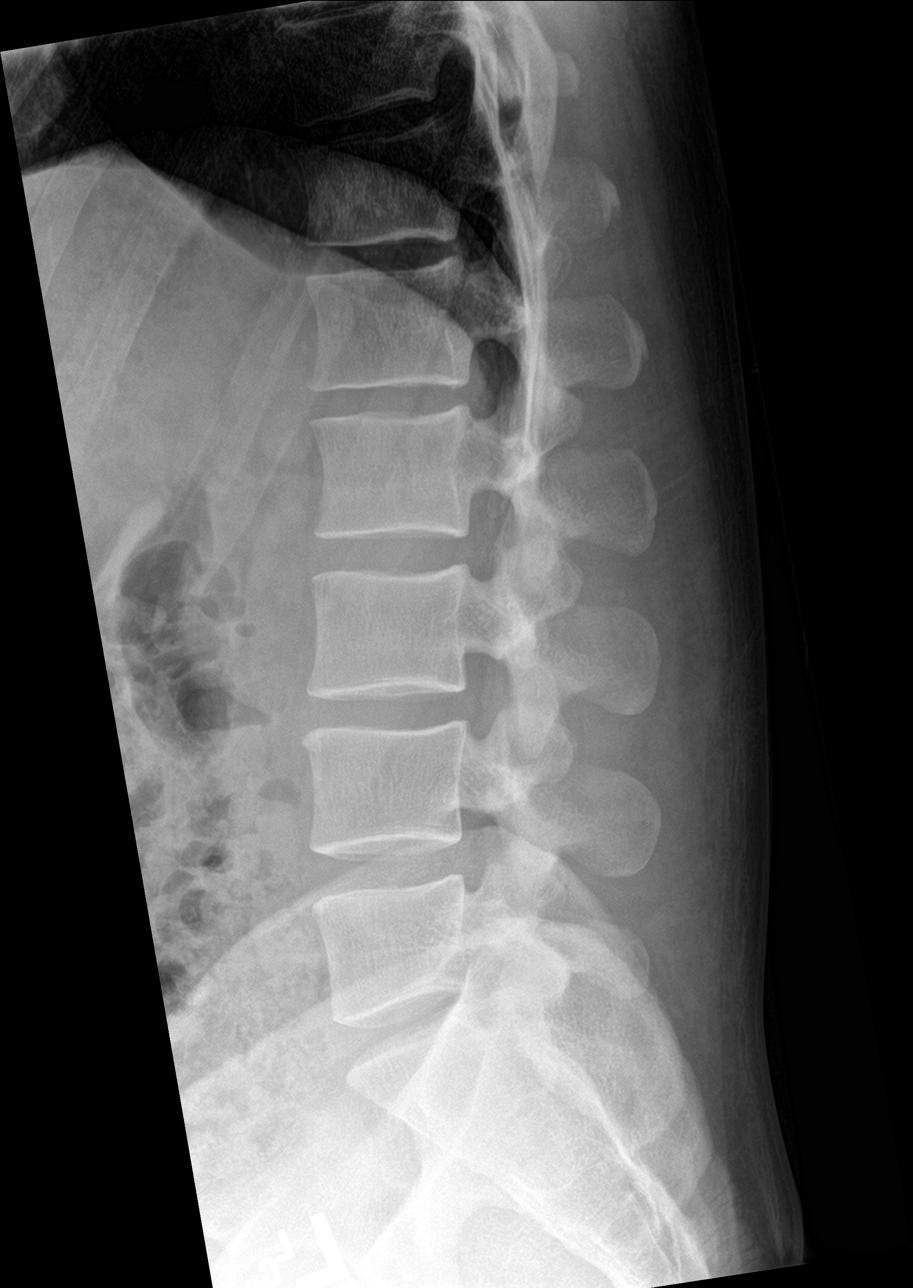
[im 5/5]
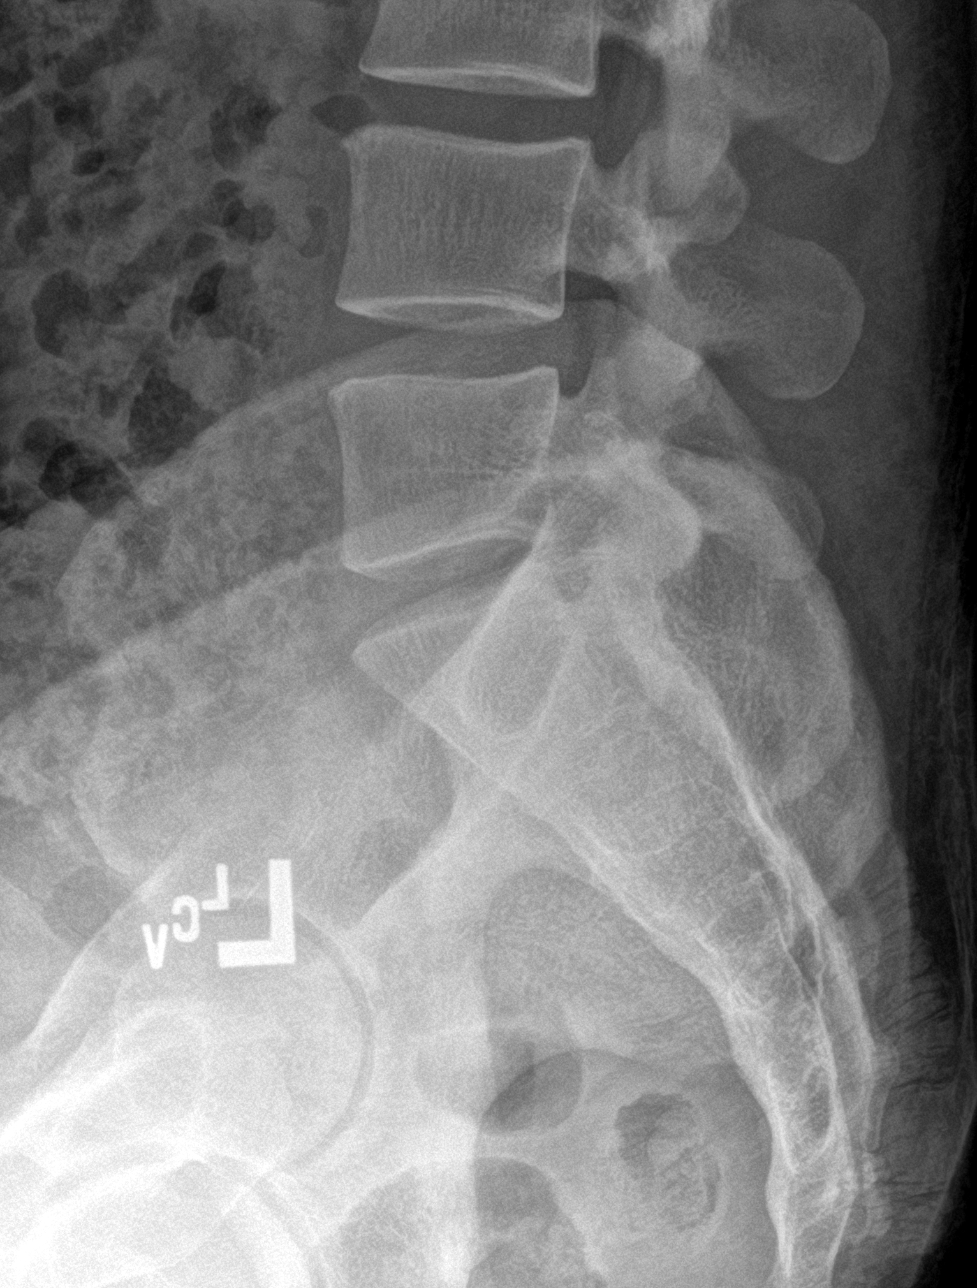

[5 of 5 positions shown; findings below may reference images not displayed]

FINDINGS: There is no evidence of lumbar spine fracture. Alignment is normal.
Intervertebral disc spaces are maintained. No significant
degenerative changes. No focal bone lesion. SI joints are open.
Nonobstructive bowel gas pattern.
IMPRESSION: Negative radiographs of the lumbar spine.

## 2020-02-28 MED ORDER — MELOXICAM 15 MG PO TABS
15.0000 mg | ORAL_TABLET | Freq: Every day | ORAL | 0 refills | Status: AC
Start: 2020-02-28 — End: 2020-03-09

## 2020-02-28 MED ORDER — CYCLOBENZAPRINE HCL 5 MG PO TABS
ORAL_TABLET | ORAL | 0 refills | Status: DC
Start: 2020-02-28 — End: 2020-10-22

## 2020-02-28 NOTE — ED Provider Notes (Signed)
[facility] Emergency Department Provider Note  ____________________________________________  Time seen: Approximately 3:37 PM  I have reviewed the triage vital signs and the nursing notes.   HISTORY  Chief Complaint Back Pain and Urinary Tract Infection    HPI Danny Russell is a 34 y.o. male that presents to the emergency department for evaluation of worsening back pain over the last couple of months.  Pain is to bilateral low back.  Pain does not radiate. Patient has a history of an enlarged prostate and prostatitis and is not sure if this has returned.  He is supposed to be on medicine for his enlarged prostate but does not take it due to to the side effects.  He states that for several months he does feel like when he has to urinate, he has to urinate quickly.  No dysuria, frequency, incontinence.  No bowel or bladder incontinence or retention or saddle anesthesias.  He does feel that he is starting to use his abdominal muscles to brace his back when he is going to stand.  Patient did go to a massage therapist to see if this would help but it did not.  No known trauma.   He does lift heavy weights.  He also lifts heavy repetitively for work and does not use proper body mechanics when he lifts for work.  He uses testosterone.  He does not use any IV drugs.  No fever, dysuria, frequency, numbness, tingling.   Past Medical History:  Diagnosis Date  . Depression   . GERD (gastroesophageal reflux disease)   . Prostatitis 2014    There are no problems to display for this patient.   History reviewed. No pertinent surgical history.  Prior to Admission medications   Medication Sig Start Date End Date Taking? Authorizing Provider  cyclobenzaprine (FLEXERIL) 5 MG tablet Take 1-2 tablets 3 times daily as needed 02/28/20   Laban Emperor, PA-C  meloxicam (MOBIC) 15 MG tablet Take 1 tablet (15 mg total) by mouth daily for 10 days. 02/28/20 03/09/20  Laban Emperor, PA-C     Allergies Patient has no known allergies.  History reviewed. No pertinent family history.  Social History Social History   Tobacco Use  . Smoking status: Former Research scientist (life sciences)  . Smokeless tobacco: Never Used  Substance Use Topics  . Alcohol use: Yes  . Drug use: Never     Review of Systems  Constitutional: No fever/chills ENT: No upper respiratory complaints. Cardiovascular: No chest pain. Respiratory: No cough. No SOB. Gastrointestinal: No abdominal pain.  No nausea, no vomiting.  Genitourinary: Negative for dysuria. Musculoskeletal: Positive for back pain. Skin: Negative for rash, abrasions, lacerations, ecchymosis. Neurological: Negative for headaches, numbness or tingling   ____________________________________________   PHYSICAL EXAM:  VITAL SIGNS: ED Triage Vitals [02/28/20 1428]  Enc Vitals Group     BP 138/85     Pulse Rate 87     Resp 18     Temp 98.1 F (36.7 C)     Temp Source Oral     SpO2 98 %     Weight 215 lb (97.5 kg)     Height 6' (1.829 m)     Head Circumference      Peak Flow      Pain Score 8     Pain Loc      Pain Edu?      Excl. in [place]?      Constitutional: Alert and oriented. Well appearing and in no acute distress.  Eyes: Conjunctivae are normal. PERRL. EOMI. Head: Atraumatic. ENT:      Ears:      Nose: No congestion/rhinnorhea.      Mouth/Throat: Mucous membranes are moist.  Neck: No stridor.  Cardiovascular: Normal rate, regular rhythm.  Good peripheral circulation. Respiratory: Normal respiratory effort without tachypnea or retractions. Lungs CTAB. Good air entry to the bases with no decreased or absent breath sounds. Gastrointestinal: Bowel sounds 4 quadrants. Soft and nontender to palpation. No guarding or rigidity. No palpable masses. No distention. Musculoskeletal: Full range of motion to all extremities. No gross deformities appreciated.  No tenderness palpation to lumbar spine. Tenderness to palpation to right lumbar  paraspinal muscles.  Strength equal in lower extremities bilaterally.  Normal gait. Neurologic:  Normal speech and language. No gross focal neurologic deficits are appreciated.  Skin:  Skin is warm, dry and intact. No rash noted. Psychiatric: Mood and affect are normal. Speech and behavior are normal. Patient exhibits appropriate insight and judgement.   ____________________________________________   LABS (all labs ordered are listed, but only abnormal results are displayed)  Labs Reviewed  URINALYSIS, COMPLETE (UACMP) WITH MICROSCOPIC - Abnormal; Notable for the following components:      Result Value   Color, Urine YELLOW (*)    APPearance HAZY (*)    All other components within normal limits  URINE CULTURE   ____________________________________________  EKG   ____________________________________________  RADIOLOGY Robinette Haines, personally viewed and evaluated these images (plain radiographs) as part of my medical decision making, as well as reviewing the written report by the radiologist.  DG Lumbar Spine Complete  Result Date: 02/28/2020 CLINICAL DATA:  Presents with lower back pain. States this pain has been going on for several months, but states now he having some lower abd pain EXAM: LUMBAR SPINE - COMPLETE 4+ VIEW COMPARISON:  None. FINDINGS: There is no evidence of lumbar spine fracture. Alignment is normal. Intervertebral disc spaces are maintained. No significant degenerative changes. No focal bone lesion. SI joints are open. Nonobstructive bowel gas pattern. IMPRESSION: Negative radiographs of the lumbar spine. Electronically Signed   By: Audie Pinto M.D.   On: 02/28/2020 16:41    ____________________________________________    PROCEDURES  Procedure(s) performed:    Procedures    Medications - No data to display   ____________________________________________   INITIAL IMPRESSION / ASSESSMENT AND PLAN / ED COURSE  Pertinent labs & imaging  results that were available during my care of the patient were reviewed by me and considered in my medical decision making (see chart for details).  Review of the [place] CSRS was performed in accordance of the [place] prior to dispensing any controlled drugs.    Patient presented to the emergency department for evaluation of worsening low back pain.  Vital signs and exam are reassuring.  No indication of infection on urinalysis.  Post void residual volume is 0 mL.  X-ray negative for acute bony abnormalities. Patient was educated on proper lifting technique at work.  Patient overall appears well and is ambulating without difficulty. He declines IM toradol. Patient will be discharged home with prescriptions for flexeril and mobic. Patient is to follow up with PCP as directed. Patient is given ED precautions to return to the ED for any worsening or new symptoms.   Danny Russell was evaluated in the Emergency Department on 02/28/2020 for the symptoms described in the history of present illness. He was evaluated in the context of the global COVID-19 pandemic, which necessitated consideration  that the patient might be at risk for infection with the SARS-CoV-2 virus that causes COVID-19. Institutional protocols and algorithms that pertain to the evaluation of patients at risk for COVID-19 are in a state of rapid change based on information released by regulatory bodies including the CDC and federal and state organizations. These policies and algorithms were followed during the patient's care in the ED.  ____________________________________________  FINAL CLINICAL IMPRESSION(S) / ED DIAGNOSES  Final diagnoses:  Bilateral low back pain without sciatica, unspecified chronicity      NEW MEDICATIONS STARTED DURING THIS VISIT:  ED Discharge Orders         Ordered    cyclobenzaprine (FLEXERIL) 5 MG tablet     Discontinue  Reprint     02/28/20 1659    meloxicam (MOBIC) 15 MG tablet  Daily     Discontinue   Reprint     02/28/20 1659              This chart was dictated using voice recognition software/Dragon. Despite best efforts to proofread, errors can occur which can change the meaning. Any change was purely unintentional.    Laban Emperor, PA-C 02/28/20 2259    Duffy Bruce, MD 02/29/20 [phone]

## 2020-02-28 NOTE — ED Notes (Signed)
See triage note  Presents with lower back pain  States this pain has been going on for several months  But states now he having some lower abd pain  Hx of prostatitis.

## 2020-02-28 NOTE — ED Triage Notes (Signed)
Pt comes POV with lower back pain across both sides for a couple of months. States that recently he has had trouble urinating also and reports that it may be an infection.

## 2020-02-28 NOTE — ED Notes (Addendum)
Patient voided in hallway bathroom immediately prior to bladder scan. Patient reports he voided a large amount. Bladder scan showed 55ml left in bladder. Provider aware.

## 2020-03-01 LAB — URINE CULTURE: Culture: <10000 — AB

## 2020-10-15 ENCOUNTER — Ambulatory Visit: Payer: Self-pay | Admitting: General Surgery

## 2020-10-15 NOTE — H&P (View-Only) (Signed)
PATIENT PROFILE: Danny Russell is a 35 y.o. male who presents to the Clinic for consultation at the request of Dr. Caryl Comes for evaluation of left inguinal hernia.  PCP:  Cheryll Cockayne, MD  HISTORY OF PRESENT ILLNESS: Mr. Current reports having a bulge in his left groin.  He reported that this has been happening for a year or 2.  He reported that it is getting more symptomatic and painful.  Pain does not radiate to other part of the body.  Pain is aggravated by heavy lifting.  There has been no alleviating factors identified.  Patient reported that he has been able to reduce the bulge.  He denies any history of abdominal distention nausea or vomiting.  He denies any history of abdominal surgeries.   PROBLEM LIST:         Problem List  Date Reviewed: 10/12/2020         Noted   Dyspepsia 11/28/2016   Overview    With prior endoscopy      Anxiety, mild 11/28/2016   Benign prostatic hyperplasia with urinary frequency 11/28/2016   Overview    Prior urology evaluation      Other hyperlipidemia 11/28/2016   Chronic tension-type headache, not intractable 08/19/2015   Overview    Previously evaluated by neurology         GENERAL REVIEW OF SYSTEMS:   General ROS: negative for - chills, fatigue, fever, weight gain or weight loss Allergy and Immunology ROS: negative for - hives  Hematological and Lymphatic ROS: negative for - bleeding problems or bruising, negative for palpable nodes Endocrine ROS: negative for - heat or cold intolerance, hair changes Respiratory ROS: negative for - cough, shortness of breath or wheezing Cardiovascular ROS: no chest pain or palpitations GI ROS: negative for nausea, vomiting, abdominal pain, diarrhea, constipation Musculoskeletal ROS: negative for - joint swelling or muscle pain Neurological ROS: negative for - confusion, syncope Dermatological ROS: negative for pruritus and rash Psychiatric: negative for anxiety, depression,  difficulty sleeping and memory loss  MEDICATIONS: Current Medications        Current Outpatient Medications  Medication Sig Dispense Refill  . DULoxetine (CYMBALTA) 30 MG DR capsule Take 30 mg by mouth once daily.  4  . omeprazole-sodium bicarbonate (ZEGERID) 40-1.1 mg-gram capsule Take 1 capsule by mouth every morning before breakfast.     No current facility-administered medications for this visit.      ALLERGIES: Augmentin [amoxicillin-pot clavulanate], Other, and Sudafed [pseudoephedrine hcl]  PAST MEDICAL HISTORY:     Past Medical History:  Diagnosis Date  . Anxiety disorder   . Anxiety, mild 11/28/2016  . Benign prostatic hyperplasia with urinary frequency 11/28/2016   Prior urology evaluation  . Depression   . Dyspepsia 11/28/2016   With prior endoscopy  . GERD (gastroesophageal reflux disease)   . Other hyperlipidemia 11/28/2016    PAST SURGICAL HISTORY:      Past Surgical History:  Procedure Laterality Date  . VASECTOMY  04/2013     FAMILY HISTORY:      Family History  Problem Relation Age of Onset  . Reflux disease Mother   . Hypothyroidism Mother   . No Known Problems Father      SOCIAL HISTORY: Social History          Socioeconomic History  . Marital status: Single    Spouse name: Not on file  . Number of children: Not on file  . Years of education: Not on file  .  Highest education level: Not on file  Occupational History  . Not on file  Tobacco Use  . Smoking status: Current Every Day Smoker  . Smokeless tobacco: Never Used  Vaping Use  . Vaping Use: Never used  Substance and Sexual Activity  . Alcohol use: Yes    Comment: social use  . Drug use: No  . Sexual activity: Yes    Partners: Female    Birth control/protection: None  Other Topics Concern  . Not on file  Social History Narrative  . Not on file   Social Determinants of Health   Financial Resource Strain: Not on file  Food Insecurity: Not  on file  Transportation Needs: Not on file      PHYSICAL EXAM:    Vitals:   10/15/20 1014  BP: (!) 165/65  Pulse: 85   Body mass index is 31.46 kg/m. Weight: (!) 105.2 kg (232 lb)   GENERAL: Alert, active, oriented x3  HEENT: Pupils equal reactive to light. Extraocular movements are intact. Sclera clear. Palpebral conjunctiva normal red color.Pharynx clear.  NECK: Supple with no palpable mass and no adenopathy.  LUNGS: Sound clear with no rales rhonchi or wheezes.  HEART: Regular rhythm S1 and S2 without murmur.  ABDOMEN: Soft and depressible, nontender with no palpable mass, no hepatomegaly.  Palpable left inguinal hernia.  Reducible.    EXTREMITIES: Well-developed well-nourished symmetrical with no dependent edema.  NEUROLOGICAL: Awake alert oriented, facial expression symmetrical, moving all extremities.  REVIEW OF DATA: I have reviewed the following data today:      Office Visit on 10/12/2020  Component Date Value  . WBC (White Blood Cell Co* 10/12/2020 8.3   . RBC (Red Blood Cell Coun* 10/12/2020 5.30   . Hemoglobin 10/12/2020 15.7   . Hematocrit 10/12/2020 46.6   . MCV (Mean Corpuscular Vo* 10/12/2020 87.9   . MCH (Mean Corpuscular He* 10/12/2020 29.6   . MCHC (Mean Corpuscular H* 10/12/2020 33.7   . Platelet Count 10/12/2020 295   . RDW-CV (Red Cell Distrib* 10/12/2020 13.0   . MPV (Mean Platelet Volum* 10/12/2020 8.6 (!)  . Neutrophils 10/12/2020 4.54   . Lymphocytes 10/12/2020 2.37   . Monocytes 10/12/2020 1.09   . Eosinophils 10/12/2020 0.17   . Basophils 10/12/2020 0.04   . Neutrophil % 10/12/2020 54.8   . Lymphocyte % 10/12/2020 28.7   . Monocyte % 10/12/2020 13.2 (!)  . Eosinophil % 10/12/2020 2.1   . Basophil% 10/12/2020 0.5   . Immature Granulocyte % 10/12/2020 0.7   . Immature Granulocyte Cou* 10/12/2020 0.06   . Glucose 10/12/2020 76   . Sodium 10/12/2020 135 (!)  . Potassium 10/12/2020 4.3   . Chloride 10/12/2020 99   .  Carbon Dioxide (CO2) 10/12/2020 28.3   . Urea Nitrogen (BUN) 10/12/2020 19   . Creatinine 10/12/2020 1.2   . Glomerular Filtration Ra* 10/12/2020 69   . Calcium 10/12/2020 9.1   . AST  10/12/2020 42 (!)  . ALT  10/12/2020 88 (!)  . Alk Phos (alkaline Phosp* 10/12/2020 75   . Albumin 10/12/2020 4.3   . Bilirubin, Total 10/12/2020 0.8   . Protein, Total 10/12/2020 6.9   . A/G Ratio 10/12/2020 1.7   . Cholesterol, Total 10/12/2020 217 (!)  . Triglyceride 10/12/2020 104   . HDL (High Density Lipopr* 10/12/2020 22.8 (!)  . LDL Calculated 10/12/2020 173 (!)  . VLDL Cholesterol 10/12/2020 21   . Cholesterol/HDL Ratio 10/12/2020 9.5   . Thyroid Stimulating  Horm* 10/12/2020 1.497   . Color 10/12/2020 Yellow   . Clarity 10/12/2020 Clear   . Specific Gravity 10/12/2020 1.025   . pH, Urine 10/12/2020 6.0   . Protein, Urinalysis 10/12/2020 Negative   . Glucose, Urinalysis 10/12/2020 Negative   . Ketones, Urinalysis 10/12/2020 Trace (!)  . Blood, Urinalysis 10/12/2020 Negative   . Nitrite, Urinalysis 10/12/2020 Negative   . Leukocyte Esterase, Urin* 10/12/2020 Negative   . White Blood Cells, Urina* 10/12/2020 None Seen   . Red Blood Cells, Urinaly* 10/12/2020 None Seen   . Bacteria, Urinalysis 10/12/2020 None Seen   . Squamous Epithelial Cell* 10/12/2020 Rare   . PSA (Prostate Specific A* 10/12/2020 0.77      ASSESSMENT: Mr. Vanderloop is a 35 y.o. male presenting for consultation for left breast bilateral inguinal hernia.    The patient presents with a symptomatic, reducible left breast bilateral inguinal hernia. Patient was oriented about the diagnosis of inguinal hernia and its implication. The patient was oriented about the treatment alternatives (observation vs surgical repair). Due to patient symptoms, repair is recommended. Patient oriented about the surgical procedure, the use of mesh and its risk of complications such as: infection, bleeding, injury to vas deference, vasculature and  testicle, injury to bowel or bladder, and chronic pain.  Non-recurrent unilateral inguinal hernia without obstruction or gangrene [K40.90]  PLAN: 1. Robotic assisted laparoscopic left vs bilateral inguinal hernia repair with mesh (37793) 2.  Avoid taking aspirin 5 days before procedure 3.  Contact us if has any question or concern.  Patient verbalized understanding, all questions were answered, and were agreeable with the plan outlined above.    Herbert Pun, MD  Electronically signed by Herbert Pun, MD

## 2020-10-15 NOTE — H&P (Signed)
PATIENT PROFILE: Danny Russell is a 35 y.o. male who presents to the Clinic for consultation at the request of Dr. Caryl Comes for evaluation of left inguinal hernia.  PCP:  Cheryll Cockayne, MD  HISTORY OF PRESENT ILLNESS: Mr. Current reports having a bulge in his left groin.  He reported that this has been happening for a year or 2.  He reported that it is getting more symptomatic and painful.  Pain does not radiate to other part of the body.  Pain is aggravated by heavy lifting.  There has been no alleviating factors identified.  Patient reported that he has been able to reduce the bulge.  He denies any history of abdominal distention nausea or vomiting.  He denies any history of abdominal surgeries.   PROBLEM LIST:         Problem List  Date Reviewed: 10/12/2020         Noted   Dyspepsia 11/28/2016   Overview    With prior endoscopy      Anxiety, mild 11/28/2016   Benign prostatic hyperplasia with urinary frequency 11/28/2016   Overview    Prior urology evaluation      Other hyperlipidemia 11/28/2016   Chronic tension-type headache, not intractable 08/19/2015   Overview    Previously evaluated by neurology         GENERAL REVIEW OF SYSTEMS:   General ROS: negative for - chills, fatigue, fever, weight gain or weight loss Allergy and Immunology ROS: negative for - hives  Hematological and Lymphatic ROS: negative for - bleeding problems or bruising, negative for palpable nodes Endocrine ROS: negative for - heat or cold intolerance, hair changes Respiratory ROS: negative for - cough, shortness of breath or wheezing Cardiovascular ROS: no chest pain or palpitations GI ROS: negative for nausea, vomiting, abdominal pain, diarrhea, constipation Musculoskeletal ROS: negative for - joint swelling or muscle pain Neurological ROS: negative for - confusion, syncope Dermatological ROS: negative for pruritus and rash Psychiatric: negative for anxiety, depression,  difficulty sleeping and memory loss  MEDICATIONS: Current Medications        Current Outpatient Medications  Medication Sig Dispense Refill  . DULoxetine (CYMBALTA) 30 MG DR capsule Take 30 mg by mouth once daily.  4  . omeprazole-sodium bicarbonate (ZEGERID) 40-1.1 mg-gram capsule Take 1 capsule by mouth every morning before breakfast.     No current facility-administered medications for this visit.      ALLERGIES: Augmentin [amoxicillin-pot clavulanate], Other, and Sudafed [pseudoephedrine hcl]  PAST MEDICAL HISTORY:     Past Medical History:  Diagnosis Date  . Anxiety disorder   . Anxiety, mild 11/28/2016  . Benign prostatic hyperplasia with urinary frequency 11/28/2016   Prior urology evaluation  . Depression   . Dyspepsia 11/28/2016   With prior endoscopy  . GERD (gastroesophageal reflux disease)   . Other hyperlipidemia 11/28/2016    PAST SURGICAL HISTORY:      Past Surgical History:  Procedure Laterality Date  . VASECTOMY  04/2013     FAMILY HISTORY:      Family History  Problem Relation Age of Onset  . Reflux disease Mother   . Hypothyroidism Mother   . No Known Problems Father      SOCIAL HISTORY: Social History          Socioeconomic History  . Marital status: Single    Spouse name: Not on file  . Number of children: Not on file  . Years of education: Not on file  .  Highest education level: Not on file  Occupational History  . Not on file  Tobacco Use  . Smoking status: Current Every Day Smoker  . Smokeless tobacco: Never Used  Vaping Use  . Vaping Use: Never used  Substance and Sexual Activity  . Alcohol use: Yes    Comment: social use  . Drug use: No  . Sexual activity: Yes    Partners: Female    Birth control/protection: None  Other Topics Concern  . Not on file  Social History Narrative  . Not on file   Social Determinants of Health   Financial Resource Strain: Not on file  Food Insecurity: Not  on file  Transportation Needs: Not on file      PHYSICAL EXAM:    Vitals:   10/15/20 1014  BP: (!) 165/65  Pulse: 85   Body mass index is 31.46 kg/m. Weight: (!) 105.2 kg (232 lb)   GENERAL: Alert, active, oriented x3  HEENT: Pupils equal reactive to light. Extraocular movements are intact. Sclera clear. Palpebral conjunctiva normal red color.Pharynx clear.  NECK: Supple with no palpable mass and no adenopathy.  LUNGS: Sound clear with no rales rhonchi or wheezes.  HEART: Regular rhythm S1 and S2 without murmur.  ABDOMEN: Soft and depressible, nontender with no palpable mass, no hepatomegaly.  Palpable left inguinal hernia.  Reducible.    EXTREMITIES: Well-developed well-nourished symmetrical with no dependent edema.  NEUROLOGICAL: Awake alert oriented, facial expression symmetrical, moving all extremities.  REVIEW OF DATA: I have reviewed the following data today:      Office Visit on 10/12/2020  Component Date Value  . WBC (White Blood Cell Co* 10/12/2020 8.3   . RBC (Red Blood Cell Coun* 10/12/2020 5.30   . Hemoglobin 10/12/2020 15.7   . Hematocrit 10/12/2020 46.6   . MCV (Mean Corpuscular Vo* 10/12/2020 87.9   . MCH (Mean Corpuscular He* 10/12/2020 29.6   . MCHC (Mean Corpuscular H* 10/12/2020 33.7   . Platelet Count 10/12/2020 295   . RDW-CV (Red Cell Distrib* 10/12/2020 13.0   . MPV (Mean Platelet Volum* 10/12/2020 8.6 (!)  . Neutrophils 10/12/2020 4.54   . Lymphocytes 10/12/2020 2.37   . Monocytes 10/12/2020 1.09   . Eosinophils 10/12/2020 0.17   . Basophils 10/12/2020 0.04   . Neutrophil % 10/12/2020 54.8   . Lymphocyte % 10/12/2020 28.7   . Monocyte % 10/12/2020 13.2 (!)  . Eosinophil % 10/12/2020 2.1   . Basophil% 10/12/2020 0.5   . Immature Granulocyte % 10/12/2020 0.7   . Immature Granulocyte Cou* 10/12/2020 0.06   . Glucose 10/12/2020 76   . Sodium 10/12/2020 135 (!)  . Potassium 10/12/2020 4.3   . Chloride 10/12/2020 99   .  Carbon Dioxide (CO2) 10/12/2020 28.3   . Urea Nitrogen (BUN) 10/12/2020 19   . Creatinine 10/12/2020 1.2   . Glomerular Filtration Ra* 10/12/2020 69   . Calcium 10/12/2020 9.1   . AST  10/12/2020 42 (!)  . ALT  10/12/2020 88 (!)  . Alk Phos (alkaline Phosp* 10/12/2020 75   . Albumin 10/12/2020 4.3   . Bilirubin, Total 10/12/2020 0.8   . Protein, Total 10/12/2020 6.9   . A/G Ratio 10/12/2020 1.7   . Cholesterol, Total 10/12/2020 217 (!)  . Triglyceride 10/12/2020 104   . HDL (High Density Lipopr* 10/12/2020 22.8 (!)  . LDL Calculated 10/12/2020 173 (!)  . VLDL Cholesterol 10/12/2020 21   . Cholesterol/HDL Ratio 10/12/2020 9.5   . Thyroid Stimulating  Horm* 10/12/2020 1.497   . Color 10/12/2020 Yellow   . Clarity 10/12/2020 Clear   . Specific Gravity 10/12/2020 1.025   . pH, Urine 10/12/2020 6.0   . Protein, Urinalysis 10/12/2020 Negative   . Glucose, Urinalysis 10/12/2020 Negative   . Ketones, Urinalysis 10/12/2020 Trace (!)  . Blood, Urinalysis 10/12/2020 Negative   . Nitrite, Urinalysis 10/12/2020 Negative   . Leukocyte Esterase, Urin* 10/12/2020 Negative   . White Blood Cells, Urina* 10/12/2020 None Seen   . Red Blood Cells, Urinaly* 10/12/2020 None Seen   . Bacteria, Urinalysis 10/12/2020 None Seen   . Squamous Epithelial Cell* 10/12/2020 Rare   . PSA (Prostate Specific A* 10/12/2020 0.77      ASSESSMENT: Mr. Vanderloop is a 35 y.o. male presenting for consultation for left breast bilateral inguinal hernia.    The patient presents with a symptomatic, reducible left breast bilateral inguinal hernia. Patient was oriented about the diagnosis of inguinal hernia and its implication. The patient was oriented about the treatment alternatives (observation vs surgical repair). Due to patient symptoms, repair is recommended. Patient oriented about the surgical procedure, the use of mesh and its risk of complications such as: infection, bleeding, injury to vas deference, vasculature and  testicle, injury to bowel or bladder, and chronic pain.  Non-recurrent unilateral inguinal hernia without obstruction or gangrene [K40.90]  PLAN: 1. Robotic assisted laparoscopic left vs bilateral inguinal hernia repair with mesh ([id number]) 2.  Avoid taking aspirin 5 days before procedure 3.  Contact us if has any question or concern.  Patient verbalized understanding, all questions were answered, and were agreeable with the plan outlined above.    Herbert Pun, MD  Electronically signed by Herbert Pun, MD

## 2020-10-28 ENCOUNTER — Encounter
Admission: RE | Admit: 2020-10-28 | Discharge: 2020-10-28 | Disposition: A | Discharge status: Home / Self Care | Payer: BC Managed Care – PPO | Source: Ambulatory Visit | Attending: General Surgery | Admitting: General Surgery

## 2020-10-28 ENCOUNTER — Other Ambulatory Visit: Payer: Self-pay

## 2020-10-28 NOTE — Patient Instructions (Signed)
Your procedure is scheduled on:11-03-20 WEDNESDAY Report to the Registration Desk on the 1st floor of the [facility]-Then proceed to the 2nd floor Surgery Desk in the [facility] To find out your arrival time, please call [phone] between 1PM - 3PM on:11-02-20 TUESDAY  REMEMBER: Instructions that are not followed completely may result in serious medical risk, up to and including death; or upon the discretion of your surgeon and anesthesiologist your surgery may need to be rescheduled.  Do not eat food after midnight the night before surgery.  No gum chewing, lozengers or hard candies.  You may however, drink CLEAR liquids up to 2 hours before you are scheduled to arrive for your surgery. Do not drink anything within 2 hours of your scheduled arrival time.  Clear liquids include: - water  - apple juice without pulp - gatorade (not RED, PURPLE, OR BLUE) - black coffee or tea (Do NOT add milk or creamers to the coffee or tea) Do NOT drink anything that is not on this list.  TAKE THESE MEDICATIONS THE MORNING OF SURGERY WITH A SIP OF WATER:  -CYMBALTA (DULOXETINE)  One week prior to surgery: Stop Anti-inflammatories (NSAIDS) such as Advil, Aleve, Ibuprofen, Motrin, Naproxen, Naprosyn and Aspirin based products such as Excedrin, Goodys Powder, BC Powder-OK TO TAKE TYLENOL IF NEEDED  Stop ANY OVER THE COUNTER supplements until after surgery-STOP FISH OIL NOW-YOU MAY RESUME AFTER SURGERY  No Alcohol for 24 hours before or after surgery.  No Smoking including e-cigarettes for 24 hours prior to surgery.  No chewable tobacco products for at least 6 hours prior to surgery.  No nicotine patches on the day of surgery.  Do not use any "recreational" drugs for at least a week prior to your surgery.  Please be advised that the combination of cocaine and anesthesia may have negative outcomes, up to and including death. If you test positive for cocaine, your surgery will be cancelled.  On  the morning of surgery brush your teeth with toothpaste and water, you may rinse your mouth with mouthwash if you wish. Do not swallow any toothpaste or mouthwash.  Do not wear jewelry, make-up, hairpins, clips or nail polish.  Do not wear lotions, powders, or perfumes.   Do not shave body from the neck down 48 hours prior to surgery just in case you cut yourself which could leave a site for infection.  Also, freshly shaved skin may become irritated if using the CHG soap.  Contact lenses, hearing aids and dentures may not be worn into surgery.  Do not bring valuables to the hospital. [facility] is not responsible for any missing/lost belongings or valuables.   Use CHG Soap as directed on instruction sheet  Notify your doctor if there is any change in your medical condition (cold, fever, infection).  Wear comfortable clothing (specific to your surgery type) to the hospital.  Plan for stool softeners for home use; pain medications have a tendency to cause constipation. You can also help prevent constipation by eating foods high in fiber such as fruits and vegetables and drinking plenty of fluids as your diet allows.  After surgery, you can help prevent lung complications by doing breathing exercises.  Take deep breaths and cough every 1-2 hours. Your doctor may order a device called an Incentive Spirometer to help you take deep breaths. When coughing or sneezing, hold a pillow firmly against your incision with both hands. This is called "splinting." Doing this helps protect your incision. It also  decreases belly discomfort.  If you are being admitted to the hospital overnight, leave your suitcase in the car. After surgery it may be brought to your room.  If you are being discharged the day of surgery, you will not be allowed to drive home. You will need a responsible adult (18 years or older) to drive you home and stay with you that night.   If you are taking public transportation,  you will need to have a responsible adult (18 years or older) with you. Please confirm with your physician that it is acceptable to use public transportation.   Please call the [facility] at [phone] if you have any questions about these instructions.  Surgery Visitation Policy:  Patients undergoing a surgery or procedure may have one family member or support person with them as long as that person is not COVID-19 positive or experiencing its symptoms.  That person may remain in the waiting area during the procedure.  Inpatient Visitation:    Visiting hours are 7 a.m. to 8 p.m. Inpatients will be allowed two visitors daily. The visitors may change each day during the patient's stay. No visitors under the age of 36. Any visitor under the age of 42 must be accompanied by an adult. The visitor must pass COVID-19 screenings, use hand sanitizer when entering and exiting the patient's room and wear a mask at all times, including in the patient's room. Patients must also wear a mask when staff or their visitor are in the room. Masking is required regardless of vaccination status.

## 2020-11-01 ENCOUNTER — Other Ambulatory Visit
Admission: RE | Admit: 2020-11-01 | Discharge: 2020-11-01 | Disposition: A | Discharge status: Home / Self Care | Payer: BC Managed Care – PPO | Source: Ambulatory Visit | Attending: General Surgery | Admitting: General Surgery

## 2020-11-01 DIAGNOSIS — Z01812 Encounter for preprocedural laboratory examination: Secondary | ICD-10-CM | POA: Insufficient documentation

## 2020-11-01 DIAGNOSIS — Z20822 Contact with and (suspected) exposure to covid-19: Secondary | ICD-10-CM | POA: Insufficient documentation

## 2020-11-01 DIAGNOSIS — K4090 Unilateral inguinal hernia, without obstruction or gangrene, not specified as recurrent: Secondary | ICD-10-CM | POA: Diagnosis not present

## 2020-11-01 DIAGNOSIS — D176 Benign lipomatous neoplasm of spermatic cord: Secondary | ICD-10-CM | POA: Diagnosis not present

## 2020-11-01 DIAGNOSIS — Z79899 Other long term (current) drug therapy: Secondary | ICD-10-CM | POA: Diagnosis not present

## 2020-11-01 LAB — SARS CORONAVIRUS 2 (TAT 6-24 HRS): SARS Coronavirus 2: NEGATIVE

## 2020-11-03 ENCOUNTER — Ambulatory Visit: Payer: BC Managed Care – PPO

## 2020-11-03 ENCOUNTER — Ambulatory Visit
Admission: RE | Admit: 2020-11-03 | Discharge: 2020-11-03 | Disposition: A | Discharge status: Home / Self Care | Payer: BC Managed Care – PPO | Attending: General Surgery | Admitting: General Surgery

## 2020-11-03 ENCOUNTER — Other Ambulatory Visit: Payer: Self-pay

## 2020-11-03 ENCOUNTER — Encounter: Payer: Self-pay | Admitting: General Surgery

## 2020-11-03 ENCOUNTER — Encounter
Admission: RE | Disposition: A | Discharge status: Home / Self Care | Payer: Self-pay | Source: Home / Self Care | Attending: General Surgery

## 2020-11-03 DIAGNOSIS — Z20822 Contact with and (suspected) exposure to covid-19: Secondary | ICD-10-CM | POA: Insufficient documentation

## 2020-11-03 DIAGNOSIS — K4090 Unilateral inguinal hernia, without obstruction or gangrene, not specified as recurrent: Secondary | ICD-10-CM | POA: Insufficient documentation

## 2020-11-03 DIAGNOSIS — Z79899 Other long term (current) drug therapy: Secondary | ICD-10-CM | POA: Insufficient documentation

## 2020-11-03 DIAGNOSIS — D176 Benign lipomatous neoplasm of spermatic cord: Secondary | ICD-10-CM | POA: Insufficient documentation

## 2020-11-03 HISTORY — PX: XI ROBOTIC ASSISTED INGUINAL HERNIA REPAIR WITH MESH: SHX6706

## 2020-11-03 SURGERY — REPAIR, HERNIA, INGUINAL, ROBOT-ASSISTED, LAPAROSCOPIC, USING MESH
Anesthesia: General | Site: Groin | Laterality: Bilateral

## 2020-11-03 MED ORDER — CHLORHEXIDINE GLUCONATE 0.12 % MT SOLN: 15.0000 mL | Freq: Once | OROMUCOSAL | Status: AC

## 2020-11-03 MED ORDER — FENTANYL CITRATE (PF) 100 MCG/2ML IJ SOLN
INTRAMUSCULAR | Status: AC
  Administered 2020-11-03: 25 ug via INTRAVENOUS
  Filled 2020-11-03: qty 2

## 2020-11-03 MED ORDER — SEVOFLURANE IN SOLN
RESPIRATORY_TRACT | Status: AC
  Filled 2020-11-03: qty 250

## 2020-11-03 MED ORDER — MIDAZOLAM HCL 2 MG/2ML IJ SOLN
INTRAMUSCULAR | Status: AC
  Filled 2020-11-03: qty 2

## 2020-11-03 MED ORDER — ONDANSETRON HCL 4 MG/2ML IJ SOLN
INTRAMUSCULAR | Status: AC
  Filled 2020-11-03: qty 2

## 2020-11-03 MED ORDER — LIDOCAINE HCL (PF) 2 % IJ SOLN
INTRAMUSCULAR | Status: AC
  Filled 2020-11-03: qty 5

## 2020-11-03 MED ORDER — PROPOFOL 10 MG/ML IV BOLUS
INTRAVENOUS | Status: DC | PRN
  Administered 2020-11-03: 200 mg via INTRAVENOUS

## 2020-11-03 MED ORDER — BUPIVACAINE-EPINEPHRINE 0.25% -1:200000 IJ SOLN
INTRAMUSCULAR | Status: DC | PRN
  Administered 2020-11-03: 30 mL

## 2020-11-03 MED ORDER — MIDAZOLAM HCL 2 MG/2ML IJ SOLN
INTRAMUSCULAR | Status: DC | PRN
  Administered 2020-11-03: 2 mg via INTRAVENOUS

## 2020-11-03 MED ORDER — FENTANYL CITRATE (PF) 100 MCG/2ML IJ SOLN
INTRAMUSCULAR | Status: DC | PRN
  Administered 2020-11-03: 25 ug via INTRAVENOUS
  Administered 2020-11-03: 100 ug via INTRAVENOUS
  Administered 2020-11-03: 25 ug via INTRAVENOUS
  Administered 2020-11-03: 50 ug via INTRAVENOUS

## 2020-11-03 MED ORDER — CHLORHEXIDINE GLUCONATE 0.12 % MT SOLN
OROMUCOSAL | Status: AC
  Administered 2020-11-03: 15 mL via OROMUCOSAL
  Filled 2020-11-03: qty 15

## 2020-11-03 MED ORDER — ONDANSETRON HCL 4 MG/2ML IJ SOLN
INTRAMUSCULAR | Status: DC | PRN
  Administered 2020-11-03: 4 mg via INTRAVENOUS

## 2020-11-03 MED ORDER — ROCURONIUM BROMIDE 10 MG/ML (PF) SYRINGE
PREFILLED_SYRINGE | INTRAVENOUS | Status: AC
  Filled 2020-11-03: qty 10

## 2020-11-03 MED ORDER — DEXAMETHASONE SODIUM PHOSPHATE 10 MG/ML IJ SOLN
INTRAMUSCULAR | Status: DC | PRN
  Administered 2020-11-03: 10 mg via INTRAVENOUS

## 2020-11-03 MED ORDER — DEXAMETHASONE SODIUM PHOSPHATE 10 MG/ML IJ SOLN
INTRAMUSCULAR | Status: AC
  Filled 2020-11-03: qty 1

## 2020-11-03 MED ORDER — LIDOCAINE HCL (CARDIAC) PF 100 MG/5ML IV SOSY
PREFILLED_SYRINGE | INTRAVENOUS | Status: DC | PRN
  Administered 2020-11-03: 100 mg via INTRAVENOUS

## 2020-11-03 MED ORDER — LACTATED RINGERS IV SOLN: INTRAVENOUS | Status: DC

## 2020-11-03 MED ORDER — CEFAZOLIN SODIUM-DEXTROSE 2-4 GM/100ML-% IV SOLN
2.0000 g | INTRAVENOUS | Status: AC
  Administered 2020-11-03: 2 g via INTRAVENOUS

## 2020-11-03 MED ORDER — PROPOFOL 10 MG/ML IV BOLUS
INTRAVENOUS | Status: AC
  Filled 2020-11-03: qty 20

## 2020-11-03 MED ORDER — FAMOTIDINE 20 MG PO TABS
ORAL_TABLET | ORAL | Status: AC
  Administered 2020-11-03: 20 mg via ORAL
  Filled 2020-11-03: qty 1

## 2020-11-03 MED ORDER — FENTANYL CITRATE (PF) 100 MCG/2ML IJ SOLN
INTRAMUSCULAR | Status: AC
  Filled 2020-11-03: qty 2

## 2020-11-03 MED ORDER — SUGAMMADEX SODIUM 200 MG/2ML IV SOLN
INTRAVENOUS | Status: DC | PRN
  Administered 2020-11-03: 200 mg via INTRAVENOUS

## 2020-11-03 MED ORDER — ACETAMINOPHEN 10 MG/ML IV SOLN
INTRAVENOUS | Status: DC | PRN
  Administered 2020-11-03: 1000 mg via INTRAVENOUS

## 2020-11-03 MED ORDER — ACETAMINOPHEN 10 MG/ML IV SOLN
INTRAVENOUS | Status: AC
  Filled 2020-11-03: qty 100

## 2020-11-03 MED ORDER — OXYCODONE HCL 5 MG PO TABS
ORAL_TABLET | ORAL | Status: AC
  Filled 2020-11-03: qty 1

## 2020-11-03 MED ORDER — OXYCODONE HCL 5 MG PO TABS
5.0000 mg | ORAL_TABLET | Freq: Once | ORAL | Status: AC
Start: 2020-11-03 — End: 2020-11-03
  Administered 2020-11-03: 5 mg via ORAL

## 2020-11-03 MED ORDER — BUPIVACAINE-EPINEPHRINE (PF) 0.25% -1:200000 IJ SOLN
INTRAMUSCULAR | Status: AC
  Filled 2020-11-03: qty 30

## 2020-11-03 MED ORDER — DEXMEDETOMIDINE (PRECEDEX) IN NS 20 MCG/5ML (4 MCG/ML) IV SYRINGE
PREFILLED_SYRINGE | INTRAVENOUS | Status: DC | PRN
  Administered 2020-11-03: 8 ug via INTRAVENOUS
  Administered 2020-11-03: 12 ug via INTRAVENOUS

## 2020-11-03 MED ORDER — PHENYLEPHRINE HCL (PRESSORS) 10 MG/ML IV SOLN
INTRAVENOUS | Status: AC
  Filled 2020-11-03: qty 1

## 2020-11-03 MED ORDER — CEFAZOLIN SODIUM-DEXTROSE 2-4 GM/100ML-% IV SOLN
INTRAVENOUS | Status: AC
  Filled 2020-11-03: qty 100

## 2020-11-03 MED ORDER — HYDROCODONE-ACETAMINOPHEN 5-325 MG PO TABS: 1.0000 | ORAL_TABLET | ORAL | 0 refills | Status: AC | PRN

## 2020-11-03 MED ORDER — FENTANYL CITRATE (PF) 100 MCG/2ML IJ SOLN
25.0000 ug | INTRAMUSCULAR | Status: DC | PRN
  Administered 2020-11-03 (×3): 25 ug via INTRAVENOUS

## 2020-11-03 MED ORDER — ORAL CARE MOUTH RINSE: 15.0000 mL | Freq: Once | OROMUCOSAL | Status: AC

## 2020-11-03 MED ORDER — ONDANSETRON HCL 4 MG/2ML IJ SOLN: 4.0000 mg | Freq: Once | INTRAMUSCULAR | Status: DC | PRN

## 2020-11-03 MED ORDER — FAMOTIDINE 20 MG PO TABS: 20.0000 mg | ORAL_TABLET | Freq: Once | ORAL | Status: AC

## 2020-11-03 MED ORDER — ROCURONIUM BROMIDE 100 MG/10ML IV SOLN
INTRAVENOUS | Status: DC | PRN
  Administered 2020-11-03: 20 mg via INTRAVENOUS
  Administered 2020-11-03: 70 mg via INTRAVENOUS
  Administered 2020-11-03: 30 mg via INTRAVENOUS

## 2020-11-03 SURGICAL SUPPLY — 54 items
ADH SKN CLS APL DERMABOND .7 (GAUZE/BANDAGES/DRESSINGS) ×1
APL PRP STRL LF DISP 70% ISPRP (MISCELLANEOUS) ×1
BAG INFUSER PRESSURE 100CC (MISCELLANEOUS) IMPLANT
BLADE SURG SZ11 CARB STEEL (BLADE) ×2 IMPLANT
CANISTER SUCT 1200ML W/VALVE (MISCELLANEOUS) IMPLANT
CANNULA REDUC XI 12-8 STAPL (CANNULA) ×1
CANNULA REDUCER 12-8 DVNC XI (CANNULA) ×1 IMPLANT
CHLORAPREP W/TINT 26 (MISCELLANEOUS) ×2 IMPLANT
COVER TIP SHEARS 8 DVNC (MISCELLANEOUS) ×1 IMPLANT
COVER TIP SHEARS 8MM DA VINCI (MISCELLANEOUS) ×1
COVER WAND RF STERILE (DRAPES) ×4 IMPLANT
DEFOGGER SCOPE WARMER CLEARIFY (MISCELLANEOUS) ×2 IMPLANT
DERMABOND ADVANCED (GAUZE/BANDAGES/DRESSINGS) ×1
DERMABOND ADVANCED .7 DNX12 (GAUZE/BANDAGES/DRESSINGS) ×1 IMPLANT
DRAPE ARM DVNC X/XI (DISPOSABLE) ×3 IMPLANT
DRAPE COLUMN DVNC XI (DISPOSABLE) ×1 IMPLANT
DRAPE DA VINCI XI ARM (DISPOSABLE) ×3
DRAPE DA VINCI XI COLUMN (DISPOSABLE) ×1
ELECT REM PT RETURN 9FT ADLT (ELECTROSURGICAL) ×2
ELECTRODE REM PT RTRN 9FT ADLT (ELECTROSURGICAL) ×1 IMPLANT
GLOVE SURG ENC MOIS LTX SZ6.5 (GLOVE) ×4 IMPLANT
GLOVE SURG UNDER POLY LF SZ6.5 (GLOVE) ×4 IMPLANT
GOWN STRL REUS W/ TWL LRG LVL3 (GOWN DISPOSABLE) ×3 IMPLANT
GOWN STRL REUS W/TWL LRG LVL3 (GOWN DISPOSABLE) ×6
IRRIGATOR SUCT 8 DISP DVNC XI (IRRIGATION / IRRIGATOR) IMPLANT
IRRIGATOR SUCTION 8MM XI DISP (IRRIGATION / IRRIGATOR)
IV CATH ANGIO 12GX3 LT BLUE (NEEDLE) IMPLANT
IV NS 1000ML (IV SOLUTION)
IV NS 1000ML BAXH (IV SOLUTION) IMPLANT
KIT PINK PAD W/HEAD ARE REST (MISCELLANEOUS) ×2
KIT PINK PAD W/HEAD ARM REST (MISCELLANEOUS) ×1 IMPLANT
LABEL OR SOLS (LABEL) IMPLANT
MANIFOLD NEPTUNE II (INSTRUMENTS) ×2 IMPLANT
MESH 3DMAX 4X6 LT LRG (Mesh General) ×2 IMPLANT
MESH 3DMAX 4X6 RT LRG (Mesh General) IMPLANT
NEEDLE HYPO 22GX1.5 SAFETY (NEEDLE) ×2 IMPLANT
NEEDLE INSUFFLATION 14GA 120MM (NEEDLE) ×2 IMPLANT
OBTURATOR OPTICAL STANDARD 8MM (TROCAR) ×1
OBTURATOR OPTICAL STND 8 DVNC (TROCAR) ×1
OBTURATOR OPTICALSTD 8 DVNC (TROCAR) ×1 IMPLANT
PACK LAP CHOLECYSTECTOMY (MISCELLANEOUS) ×2 IMPLANT
SEAL CANN UNIV 5-8 DVNC XI (MISCELLANEOUS) ×3 IMPLANT
SEAL XI 5MM-8MM UNIVERSAL (MISCELLANEOUS) ×3
SET TUBE SMOKE EVAC HIGH FLOW (TUBING) ×2 IMPLANT
SOLUTION ELECTROLUBE (MISCELLANEOUS) ×2 IMPLANT
SPONGE LAP 4X18 RFD (DISPOSABLE) ×2 IMPLANT
SUT MNCRL 4-0 (SUTURE) ×2
SUT MNCRL 4-0 27XMFL (SUTURE) ×1
SUT VIC AB 2-0 SH 27 (SUTURE) ×2
SUT VIC AB 2-0 SH 27XBRD (SUTURE) ×1 IMPLANT
SUT VLOC 90 S/L VL9 GS22 (SUTURE) ×2 IMPLANT
SUTURE MNCRL 4-0 27XMF (SUTURE) ×1 IMPLANT
TAPE TRANSPORE STRL 2 31045 (GAUZE/BANDAGES/DRESSINGS) IMPLANT
TRAY FOLEY MTR SLVR 16FR STAT (SET/KITS/TRAYS/PACK) ×2 IMPLANT

## 2020-11-03 NOTE — Op Note (Signed)
Preoperative diagnosis: Left inguinal hernia.   Postoperative diagnosis: Left inguinal hernia.  Procedure: Robotic assisted Laparoscopic Transabdominal preperitoneal laparoscopic (TAPP) repair of left inguinal hernia.  Anesthesia: GETA  Surgeon: Dr. Windell Moment  Wound Classification: Clean  Indications:  Patient is a 35 y.o. male developed a symptomatic left inguinal hernia. Repair was indicated.  Findings: 1.  Left indirect inguinal hernia identified   2. Vas deferens and cord structures identified and preserved 3. Bard 3D Max mesh used for repair 4. Adequate hemostasis.  5.  No hernia identified on the right groin:   Description of procedure: The patient was taken to the operating room and the correct side of surgery was verified. The patient was placed supine with arms tucked at the sides. After obtaining adequate anesthesia, the patient's abdomen was prepped and draped in standard sterile fashion. The patient was placed in the Trendelenburg position. A time-out was completed verifying correct patient, procedure, site, positioning, and implant(s) and/or special equipment prior to beginning this procedure. A Veress needle was placed at the umbilicus and pneumoperitoneum created with insufflation of carbon dioxide to 15 mmHg. After the Veress needle was removed, an 8-mm trocar was placed on epigastric area and the 30 angled laparoscope inserted. Two 8-mm trocars were then placed lateral to the rectus sheath under direct visualization. Both inguinal regions were inspected and the median umbilical ligament, medial umbilical ligament, and lateral umbilical fold were identified.  The robotic arms were docked. The robotic scope was inserted and the pelvic area anatomy targeted.  The peritoneum was incised with scissors along a line 5 cm above the superior edge of the hernia defect, extending from the median umbilical ligament to the anterior superior iliac spine. The peritoneal flap was  mobilized inferiorly using blunt and sharp dissection. The inferior epigastric vessels were exposed and the pubic symphysis was identified. Cooper's ligament was dissected to its junction with the iliac vein. The dissection was continued inferiorly to the iliopubic tract, with care taken to avoid injury to the femoral branch of the genitofemoral nerve and the lateral femoral cutaneous nerve. The cord structures were parietalized. The hernia was identified and reduced by gentle traction.  The left in the right hernia sac was noted mobilized from the cord structures and reduced into the peritoneal cavity.  Cord lipoma was resected A large piece of mesh was rolled longitudinally into a compact cylinder and passed through a trocar. The cylinder was placed along the inferior aspect of the working space and unrolled into place to completely cover the direct, indirect, and femoral spaces. The mesh was secured into place superiorly to the anterior abdominal wall and inferiorly and medially to Cooper's ligament with absorbable sutures. Care was taken to avoid the inferolateral triangles containing the iliac vessels and genital nerves. The peritoneal flap was closed over the mesh and secured with suture in similar positions of safety. After ensuring adequate hemostasis, the trocars were removed and the pneumoperitoneum allowed to escape. The trocar incisions were closed using monocryl and skin adhesive dressings applied.  The patient tolerated the procedure well and was taken to the postanesthesia care unit in stable condition.   Specimen: Cord lipoma  Complications: None  Estimated Blood Loss: 10 mL

## 2020-11-03 NOTE — Interval H&P Note (Signed)
History and Physical Interval Note:  11/03/2020 6:54 AM  Danny Russell  has presented today for surgery, with the diagnosis of K40.90 non recurrent unilateral inguinal hernia w/o obstruction or gangrene.  The various methods of treatment have been discussed with the patient and family. After consideration of risks, benefits and other options for treatment, the patient has consented to  Procedure(s): XI ROBOTIC [name] (Bilateral) as a surgical intervention.  The patient's history has been reviewed, patient examined, no change in status, stable for surgery.  I have reviewed the patient's chart and labs.  Questions were answered to the patient's satisfaction.     Herbert Pun

## 2020-11-03 NOTE — Anesthesia Procedure Notes (Signed)
Procedure Name: Intubation Date/Time: 11/03/2020 7:32 AM Performed by: Lerry Liner, CRNA Pre-anesthesia Checklist: Patient identified, Emergency Drugs available, Suction available and Patient being monitored Patient Re-evaluated:Patient Re-evaluated prior to induction Oxygen Delivery Method: Circle system utilized Preoxygenation: Pre-oxygenation with 100% oxygen Induction Type: IV induction Ventilation: Mask ventilation without difficulty Laryngoscope Size: Mac and 4 Grade View: Grade I Tube type: Oral Tube size: 7.5 mm Number of attempts: 1 Airway Equipment and Method: Stylet Placement Confirmation: ETT inserted through vocal cords under direct vision,  positive ETCO2 and breath sounds checked- equal and bilateral Secured at: 23 cm Tube secured with: Tape Dental Injury: Teeth and Oropharynx as per pre-operative assessment  Comments: Performed by Marguarite Arbour Rickerts SRNA

## 2020-11-03 NOTE — Anesthesia Preprocedure Evaluation (Signed)
Anesthesia Evaluation  Patient identified by MRN, date of birth, ID band Patient awake    Reviewed: Allergy & Precautions, H&P , NPO status , Patient's Chart, lab work & pertinent test results, reviewed documented beta blocker date and time   Airway Mallampati: II  TM Distance: >3 FB Neck ROM: full    Dental  (+) Teeth Intact   Pulmonary neg pulmonary ROS, former smoker,    Pulmonary exam normal        Cardiovascular Exercise Tolerance: Good negative cardio ROS Normal cardiovascular exam Rhythm:regular Rate:Normal     Neuro/Psych PSYCHIATRIC DISORDERS Depression negative neurological ROS     GI/Hepatic Neg liver ROS, GERD  Medicated,  Endo/Other  negative endocrine ROS  Renal/GU negative Renal ROS  negative genitourinary   Musculoskeletal   Abdominal   Peds  Hematology negative hematology ROS (+)   Anesthesia Other Findings Past Medical History: No date: Depression No date: GERD (gastroesophageal reflux disease) 2014: Prostatitis Past Surgical History: No date: ESOPHAGOGASTRODUODENOSCOPY No date: VASECTOMY 2020: WRIST SURGERY; Right     Comment:  cyst  BMI    Body Mass Index: 30.52 kg/m     Reproductive/Obstetrics negative OB ROS                             Anesthesia Physical Anesthesia Plan  ASA: II  Anesthesia Plan: General ETT   Post-op Pain Management:    Induction:   PONV Risk Score and Plan: 3  Airway Management Planned:   Additional Equipment:   Intra-op Plan:   Post-operative Plan:   Informed Consent: I have reviewed the patients History and Physical, chart, labs and discussed the procedure including the risks, benefits and alternatives for the proposed anesthesia with the patient or authorized representative who has indicated his/her understanding and acceptance.     Dental Advisory Given  Plan Discussed with: CRNA  Anesthesia Plan Comments:          Anesthesia Quick Evaluation

## 2020-11-03 NOTE — Transfer of Care (Signed)
Immediate Anesthesia Transfer of Care Note  Patient: Danny Russell  Procedure(s) Performed: XI ROBOTIC ASSISTED INGUINAL HERNIA REPAIR WITH MESH (Bilateral Groin)  Patient Location: PACU  Anesthesia Type:General  Level of Consciousness: awake, drowsy and patient cooperative  Airway & Oxygen Therapy: Patient Spontanous Breathing and Patient connected to face mask oxygen  Post-op Assessment: Report given to RN, Post -op Vital signs reviewed and stable and Patient moving all extremities  Post vital signs: Reviewed and stable  Last Vitals:  Vitals Value Taken Time  BP 145/92 11/03/20 0932  Temp    Pulse 95 11/03/20 0934  Resp 22 11/03/20 0934  SpO2 99 % 11/03/20 0934  Vitals shown include unvalidated device data.  Last Pain:  Vitals:   11/03/20 0629  TempSrc: Tympanic  PainSc: 0-No pain      Patients Stated Pain Goal: 0 (95/84/41 7127)  Complications: No complications documented.

## 2020-11-03 NOTE — Discharge Instructions (Signed)
AMBULATORY SURGERY  DISCHARGE INSTRUCTIONS   1) The drugs that you were given will stay in your system until tomorrow so for the next 24 hours you should not:  A) Drive an automobile B) Make any legal decisions C) Drink any alcoholic beverage   2) You may resume regular meals tomorrow.  Today it is better to start with liquids and gradually work up to solid foods.  You may eat anything you prefer, but it is better to start with liquids, then soup and crackers, and gradually work up to solid foods.   3) Please notify your doctor immediately if you have any unusual bleeding, trouble breathing, redness and pain at the surgery site, drainage, fever, or pain not relieved by medication.    4) Additional Instructions:        Please contact your physician with any problems or Same Day Surgery at [phone], Monday through Friday 6 am to 4 pm, or [facility] Main number at [phone]. Diet: Resume home heart healthy regular diet.   Activity: No heavy lifting >20 pounds (children, pets, laundry, garbage) or strenuous activity until follow-up, but light activity and walking are encouraged. Do not drive or drink alcohol if taking narcotic pain medications.  Wound care: May shower with soapy water and pat dry (do not rub incisions), but no baths or submerging incision underwater until follow-up. (no swimming)   Medications: Resume all home medications. For mild to moderate pain: acetaminophen (Tylenol) or ibuprofen (if no kidney disease). Combining Tylenol with alcohol can substantially increase your risk of causing liver disease. Narcotic pain medications, if prescribed, can be used for severe pain, though may cause nausea, constipation, and drowsiness. Do not combine Tylenol and Norco within a 6 hour period as Norco contains Tylenol. If you do not need the narcotic pain medication, you do not need to fill the prescription.  Call office ([phone]) at any time if any  questions, worsening pain, fevers/chills, bleeding, drainage from incision site, or other concerns.  

## 2020-11-04 LAB — SURGICAL PATHOLOGY

## 2020-11-10 ENCOUNTER — Other Ambulatory Visit: Payer: Self-pay

## 2020-11-10 ENCOUNTER — Other Ambulatory Visit: Payer: Self-pay | Admitting: General Surgery

## 2020-11-10 ENCOUNTER — Ambulatory Visit
Admission: RE | Admit: 2020-11-10 | Discharge: 2020-11-10 | Disposition: A | Discharge status: Home / Self Care | Payer: BC Managed Care – PPO | Source: Ambulatory Visit | Attending: General Surgery | Admitting: General Surgery

## 2020-11-10 DIAGNOSIS — M79604 Pain in right leg: Secondary | ICD-10-CM | POA: Diagnosis present

## 2020-11-10 DIAGNOSIS — G8918 Other acute postprocedural pain: Secondary | ICD-10-CM | POA: Insufficient documentation

## 2020-11-10 DIAGNOSIS — M79605 Pain in left leg: Secondary | ICD-10-CM

## 2020-11-10 IMAGING — US US EXTREM LOW VENOUS
1 series · 13 of 24 positions shown · non-contrast
Comparison: None.

CLINICAL DATA: Bilateral lower extremity pain and edema. Status
post left inguinal hernia repair on [DATE]



[Series 1: us venous img lower bilat (dvt) · portal-venous · 13 of 56 slices shown]
[im 1/56]
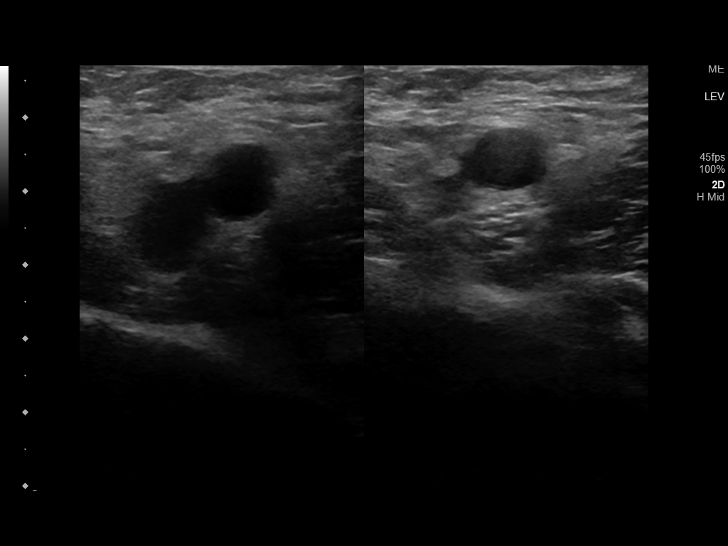
[im 5/56]
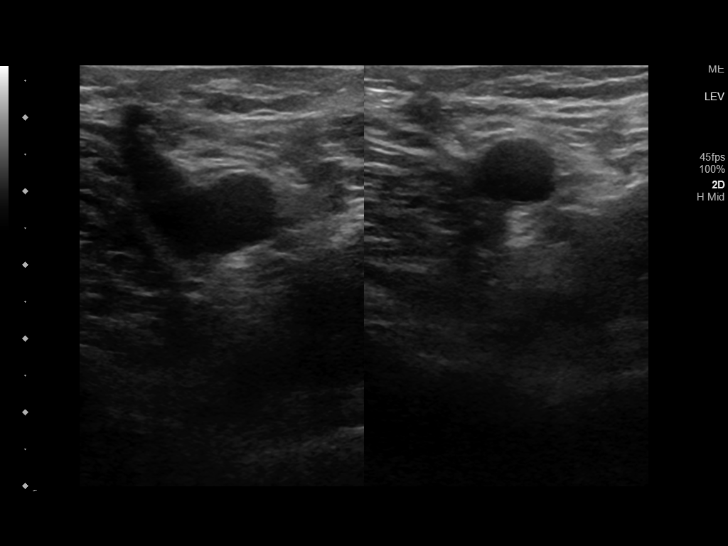
[im 10/56]
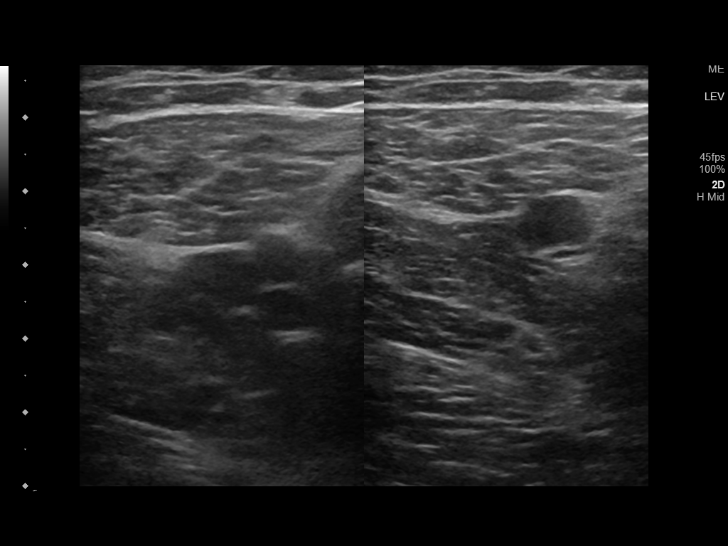
[im 15/56]
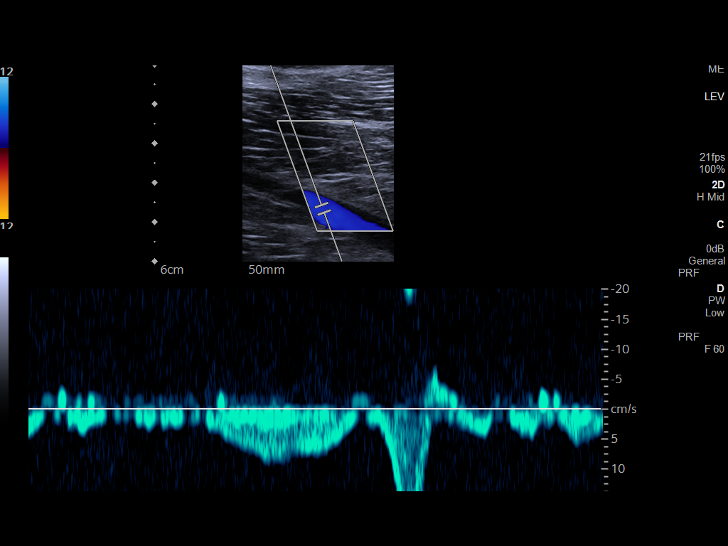
[im 20/56]
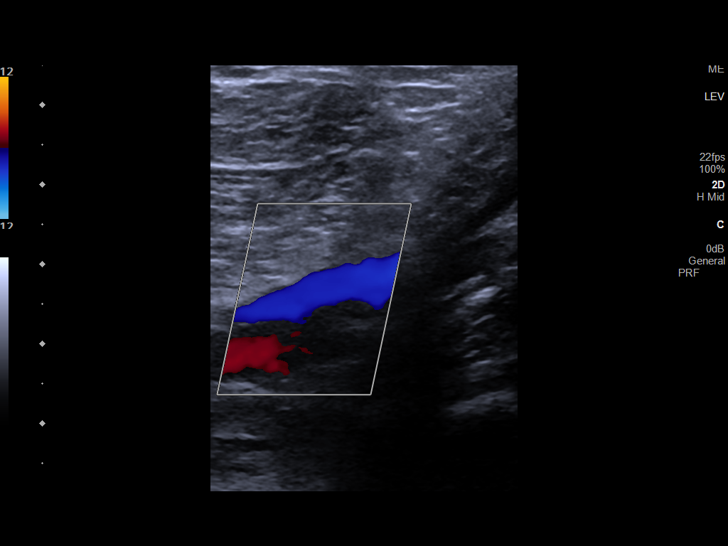
[im 24/56]
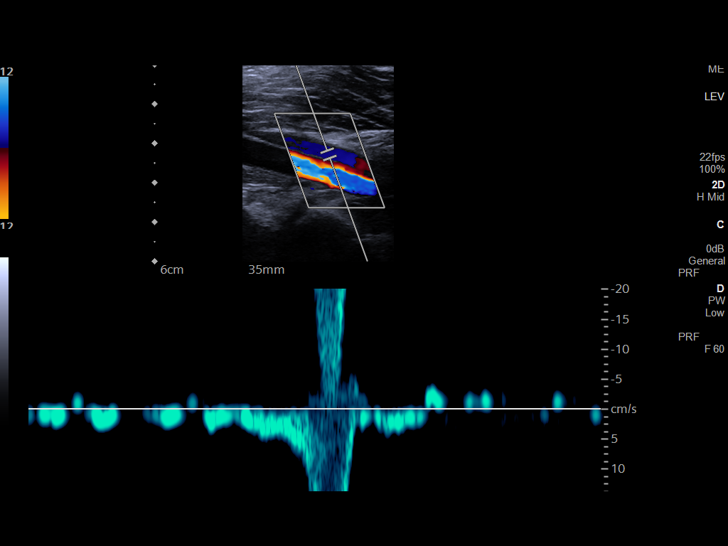
[im 29/56]
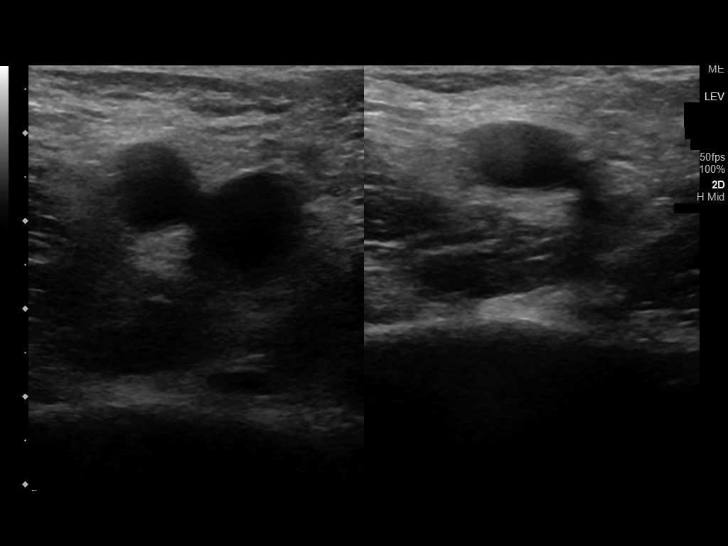
[im 32/56]
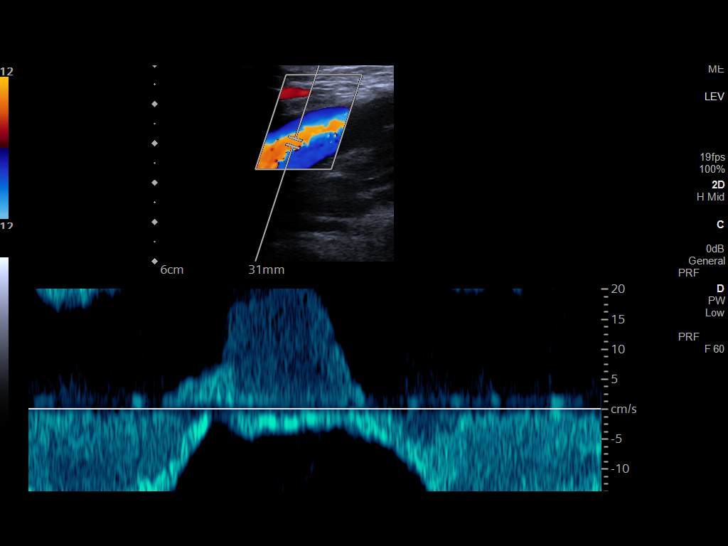
[im 36/56]
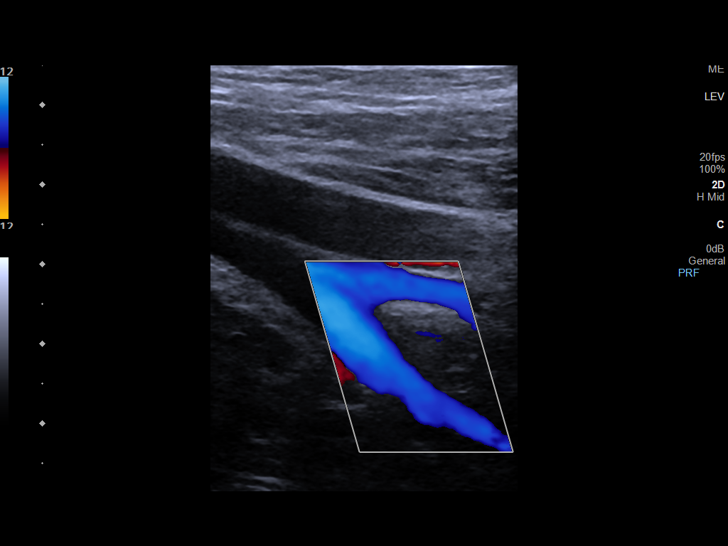
[im 41/56]
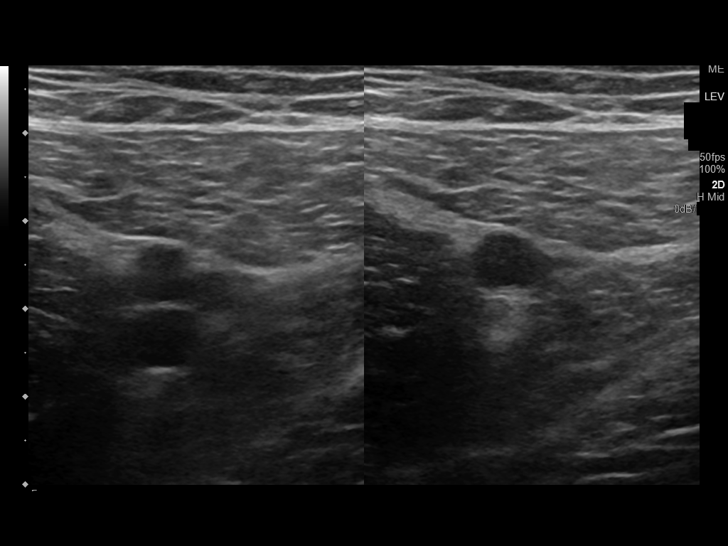
[im 46/56]
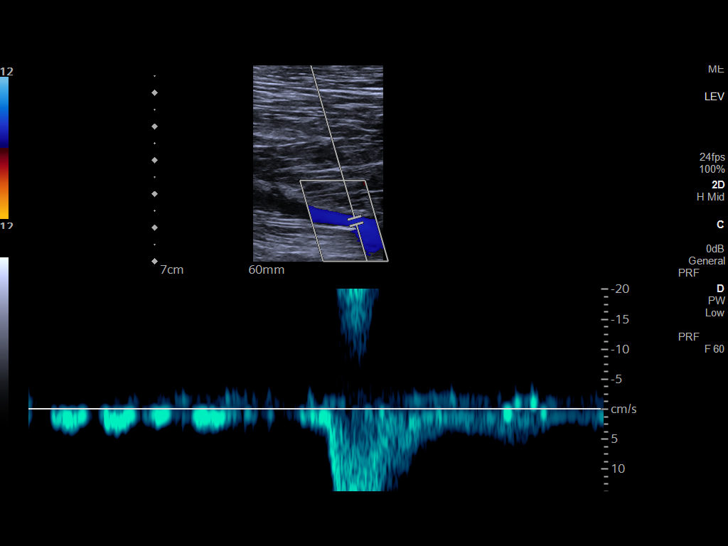
[im 51/56]
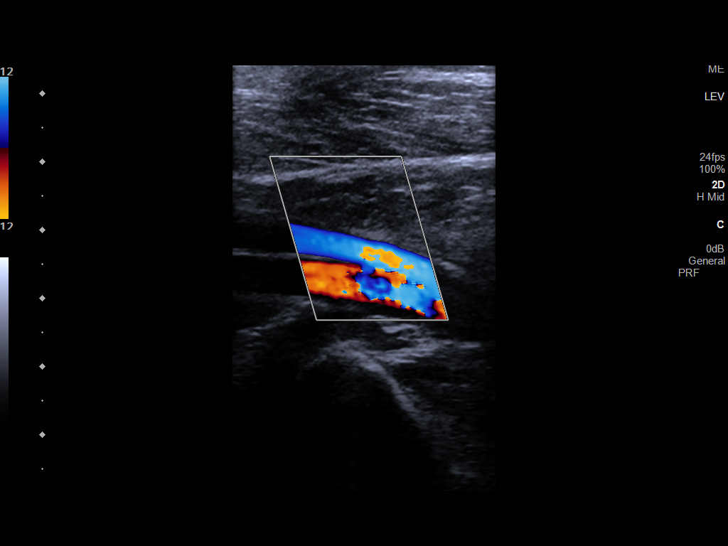
[im 56/56]
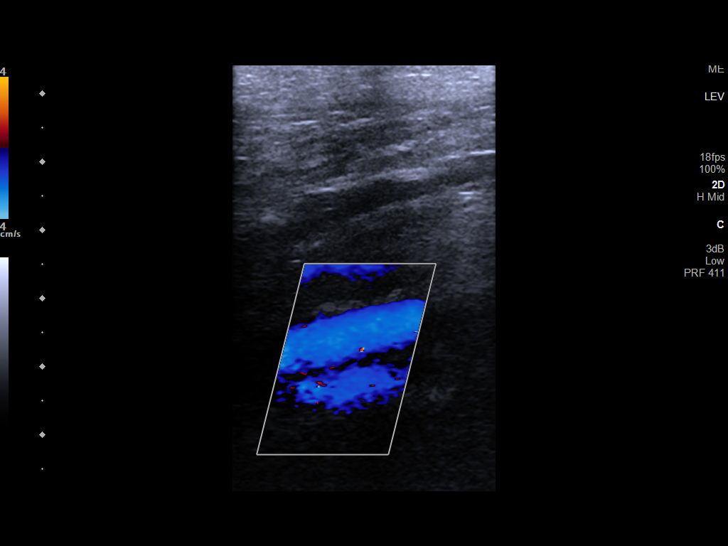

[13 of 24 positions shown; findings below may reference images not displayed]

FINDINGS: RIGHT LOWER EXTREMITY

Common Femoral Vein: No evidence of thrombus. Normal
compressibility, respiratory phasicity and response to augmentation.

Saphenofemoral Junction: No evidence of thrombus. Normal
compressibility and flow on color Doppler imaging.

Profunda Femoral Vein: No evidence of thrombus. Normal
compressibility and flow on color Doppler imaging.

Femoral Vein: No evidence of thrombus. Normal compressibility,
respiratory phasicity and response to augmentation.

Popliteal Vein: No evidence of thrombus. Normal compressibility,
respiratory phasicity and response to augmentation.

Calf Veins: No evidence of thrombus. Normal compressibility and flow
on color Doppler imaging.

Superficial Great Saphenous Vein: No evidence of thrombus. Normal
compressibility.

Venous Reflux:  None.

Other Findings: No evidence of superficial thrombophlebitis or
abnormal fluid collection.

LEFT LOWER EXTREMITY

Common Femoral Vein: No evidence of thrombus. Normal
compressibility, respiratory phasicity and response to augmentation.

Saphenofemoral Junction: No evidence of thrombus. Normal
compressibility and flow on color Doppler imaging.

Profunda Femoral Vein: No evidence of thrombus. Normal
compressibility and flow on color Doppler imaging.

Femoral Vein: No evidence of thrombus. Normal compressibility,
respiratory phasicity and response to augmentation.

Popliteal Vein: No evidence of thrombus. Normal compressibility,
respiratory phasicity and response to augmentation.

Calf Veins: No evidence of thrombus. Normal compressibility and flow
on color Doppler imaging.

Superficial Great Saphenous Vein: No evidence of thrombus. Normal
compressibility.

Venous Reflux:  None.

Other Findings: No evidence of superficial thrombophlebitis or
abnormal fluid collection.
IMPRESSION: No evidence of deep venous thrombosis in either lower extremity.

## 2020-11-10 NOTE — Anesthesia Postprocedure Evaluation (Signed)
Anesthesia Post Note  Patient: Danny Russell  Procedure(s) Performed: XI ROBOTIC ASSISTED INGUINAL HERNIA REPAIR WITH MESH (Bilateral Groin)  Patient location during evaluation: PACU Anesthesia Type: General Level of consciousness: awake and alert Pain management: pain level controlled Vital Signs Assessment: post-procedure vital signs reviewed and stable Respiratory status: spontaneous breathing, nonlabored ventilation, respiratory function stable and patient connected to nasal cannula oxygen Cardiovascular status: blood pressure returned to baseline and stable Postop Assessment: no apparent nausea or vomiting Anesthetic complications: no   No complications documented.   Last Vitals:  Vitals:   11/03/20 1035 11/03/20 1130  BP: 140/84 (!) 151/87  Pulse: 77 80  Resp: 16 16  Temp: 36.8 C   SpO2: 99% 98%    Last Pain:  Vitals:   11/04/20 0843  TempSrc:   PainSc: [place]

## 2021-08-22 ENCOUNTER — Other Ambulatory Visit: Payer: Self-pay | Admitting: Surgery

## 2021-08-22 DIAGNOSIS — S46211A Strain of muscle, fascia and tendon of other parts of biceps, right arm, initial encounter: Secondary | ICD-10-CM

## 2021-08-23 ENCOUNTER — Ambulatory Visit
Admission: RE | Admit: 2021-08-23 | Discharge: 2021-08-23 | Disposition: A | Discharge status: Home / Self Care | Payer: BC Managed Care – PPO | Source: Ambulatory Visit | Attending: Surgery | Admitting: Surgery

## 2021-08-23 ENCOUNTER — Other Ambulatory Visit: Payer: Self-pay | Admitting: Surgery

## 2021-08-23 DIAGNOSIS — S46211A Strain of muscle, fascia and tendon of other parts of biceps, right arm, initial encounter: Secondary | ICD-10-CM

## 2021-08-23 IMAGING — MR MR ELBOW*R* W/O CM
5 series · 40 of 40 positions shown · non-contrast
Comparison: None.

CLINICAL DATA: Patient injured right elbow 2 days ago throwing a
large branch. Right elbow pain that radiates down the forearm

EXAM:
MRI OF THE RIGHT ELBOW WITHOUT CONTRAST
TECHNIQUE: Multiplanar, multisequence MR imaging of the elbow was performed. No
intravenous contrast was administered.

[Series 7: T2 fat-sat · coronal · right · 3.0mm · 0.55mm/px · 8 of 25 slices shown (1 of 3)]
[im 1/25]
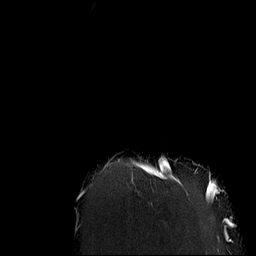
[im 4/25]
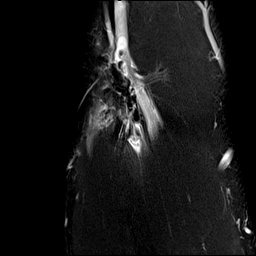
[im 7/25]
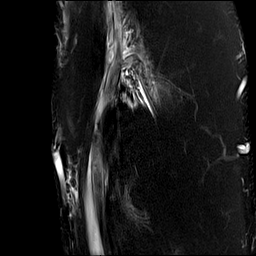
[im 11/25]
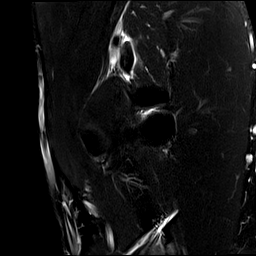
[im 14/25]
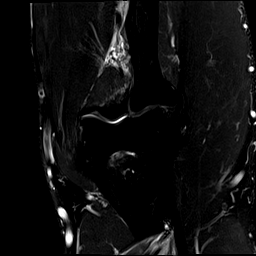
[im 18/25]
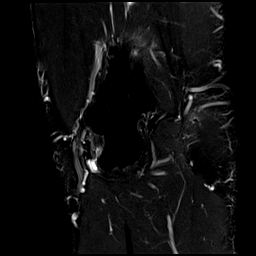
[im 21/25]
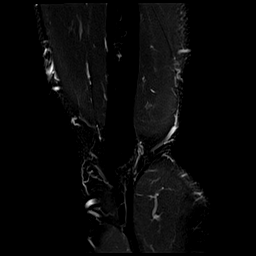
[im 25/25]
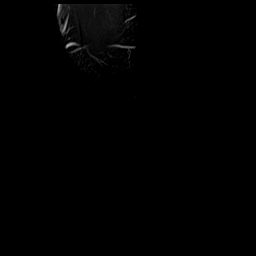

[Series 8: T2 fat-sat · axial · right · 3.0mm · 0.47mm/px · z∈[-66,+32]mm · 8 of 28 slices shown (2 of 3)]
[im 1/28]
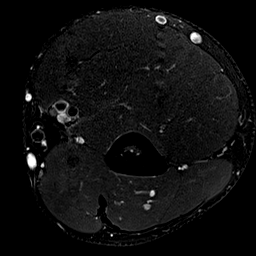
[im 4/28]
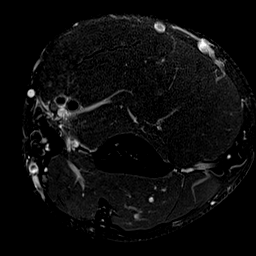
[im 8/28]
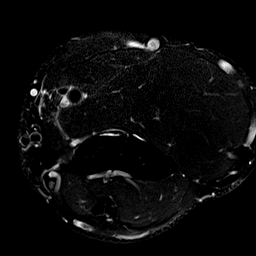
[im 12/28]
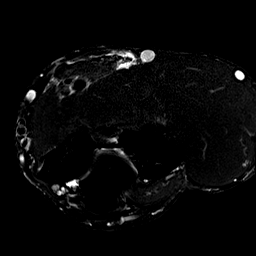
[im 16/28]
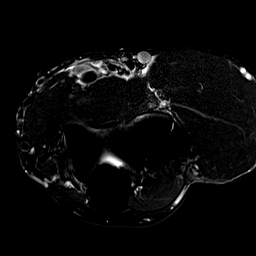
[im 20/28]
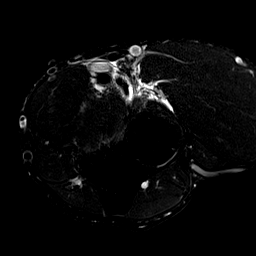
[im 24/28]
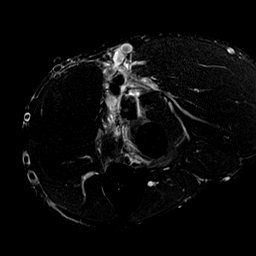
[im 28/28]
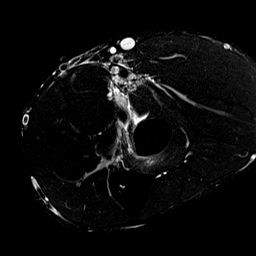

[Series 9: T1 · coronal · right · 3.0mm · 0.55mm/px · 8 of 25 slices shown (1 of 2)]
[im 1/25]
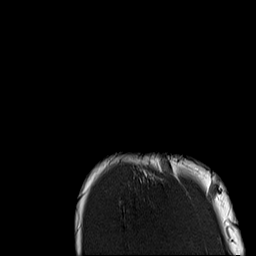
[im 4/25]
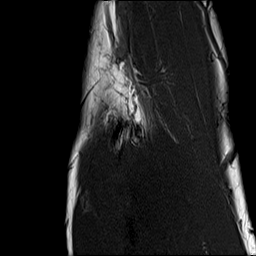
[im 7/25]
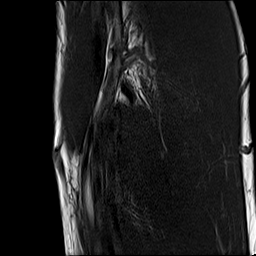
[im 11/25]
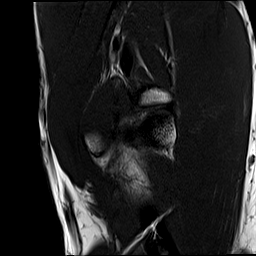
[im 14/25]
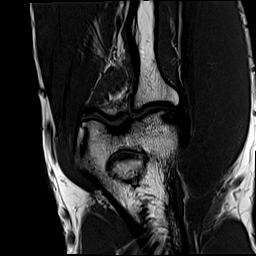
[im 18/25]
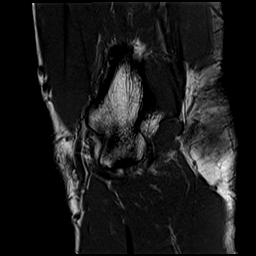
[im 21/25]
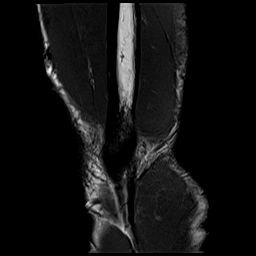
[im 25/25]
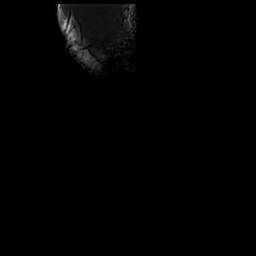

[Series 10: T2 fat-sat · sagittal · right · 3.0mm · 0.55mm/px · 8 of 27 slices shown (3 of 3)]
[im 1/27]
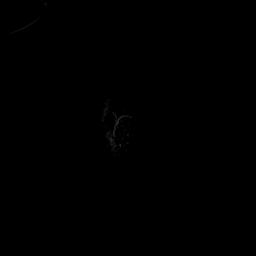
[im 4/27]
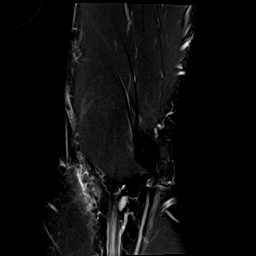
[im 8/27]
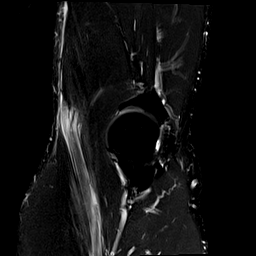
[im 12/27]
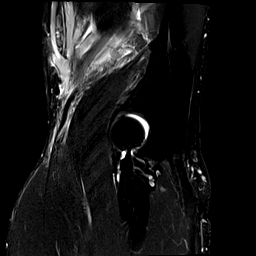
[im 15/27]
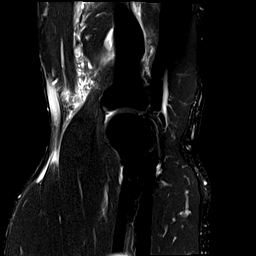
[im 19/27]
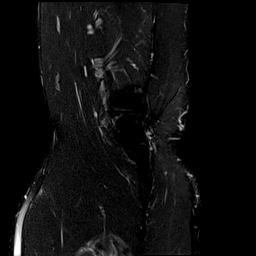
[im 23/27]
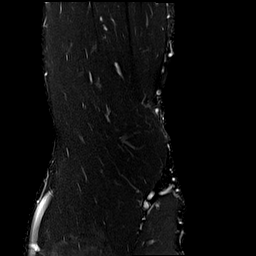
[im 27/27]
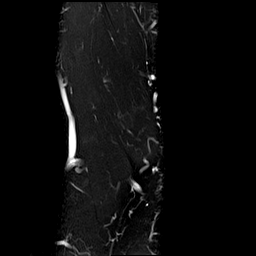

[Series 11: T1 · axial · right · 3.0mm · 0.47mm/px · z∈[-66,+32]mm · 8 of 28 slices shown (2 of 2)]
[im 1/28]
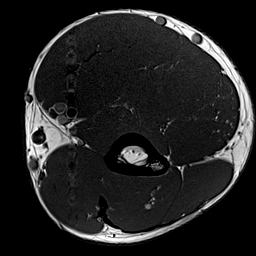
[im 4/28]
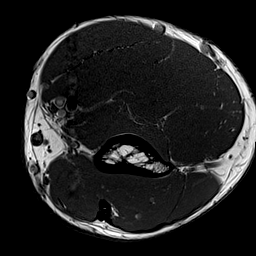
[im 8/28]
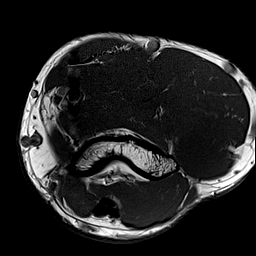
[im 12/28]
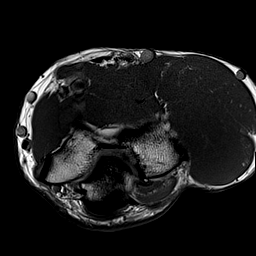
[im 16/28]
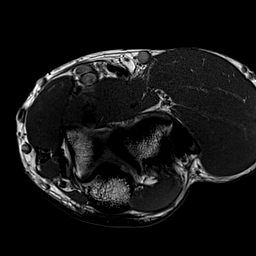
[im 20/28]
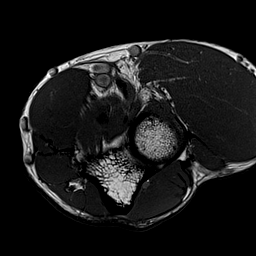
[im 24/28]
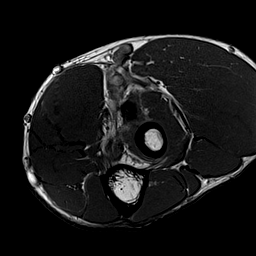
[im 28/28]
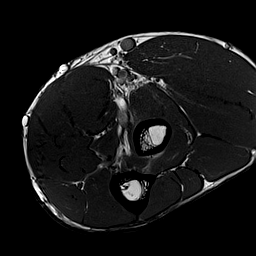

[40 of 40 positions shown; findings below may reference images not displayed]

FINDINGS: TENDONS

Common forearm flexor origin: Intact

Common forearm extensor origin: Intact

Biceps: There is heterogeneous signal, thinning and fluid along the
distal biceps tendon consistent with high-grade/near complete tear
of the biceps tendon. Biceps aponeurosis appear intact.

Triceps: Intact

LIGAMENTS

Medial stabilizers: Intact

Lateral stabilizers:  Intact

Cartilage: No chondral defect.

Joint: No joint effusion. No synovitis.

Cubital tunnel: Normal.

Bones: No fracture or dislocation. No marrow abnormality.

Soft Tissues: Muscles are normal in bulk. Fluid tracking along the
distal biceps tendon.
IMPRESSION: High-grade to near complete tear of the biceps tendon about its
insertion at the radial tuberosity.

## 2021-08-24 ENCOUNTER — Other Ambulatory Visit: Payer: Self-pay | Admitting: Surgery

## 2021-08-26 ENCOUNTER — Other Ambulatory Visit: Payer: Self-pay

## 2021-08-26 ENCOUNTER — Encounter
Admission: RE | Admit: 2021-08-26 | Discharge: 2021-08-26 | Disposition: A | Discharge status: Home / Self Care | Payer: BC Managed Care – PPO | Source: Ambulatory Visit | Attending: Surgery | Admitting: Surgery

## 2021-08-26 NOTE — Patient Instructions (Addendum)
Your procedure is scheduled on: 09/01/21 Report to [facility]. To find out your arrival time please call [phone] between 1PM - 3PM on 08/31/21.  Remember: Instructions that are not followed completely may result in serious medical risk, up to and including death, or upon the discretion of your surgeon and anesthesiologist your surgery may need to be rescheduled.     _X__ 1. Do not eat food after midnight the night before your procedure.                 No gum chewing or hard candies. You may drink clear liquids up to 2 hours                 before you are scheduled to arrive for your surgery- DO not drink clear                 liquids within 2 hours of the start of your surgery.                 Clear Liquids include:  water, apple juice without pulp, clear carbohydrate                 drink such as Clearfast or Gatorade, Black Coffee or Tea (Do not add                 anything to coffee or tea). Diabetics water only  __X__2.  On the morning of surgery brush your teeth with toothpaste and water, you                 may rinse your mouth with mouthwash if you wish.  Do not swallow any              toothpaste of mouthwash.     _X__ 3.  No Alcohol for 24 hours before or after surgery.   _X__ 4.  Do Not Smoke or use e-cigarettes For 24 Hours Prior to Your Surgery.                 Do not use any chewable tobacco products for at least 6 hours prior to                 surgery.  ____  5.  Bring all medications with you on the day of surgery if instructed.   __X__  6.  Notify your doctor if there is any change in your medical condition      (cold, fever, infections).     Do not wear jewelry, make-up, hairpins, clips or nail polish. Do not wear lotions, powders, or perfumes. No deodorant Do not shave body hair 48 hours prior to surgery. Men may shave face and neck. Do not bring valuables to the hospital.    [facility] is not responsible  for any belongings or valuables.  Contacts, dentures/partials or body piercings may not be worn into surgery. Bring a case for your contacts, glasses or hearing aids, a denture cup will be supplied. Leave your suitcase in the car. After surgery it may be brought to your room. For patients admitted to the hospital, discharge time is determined by your treatment team.   Patients discharged the day of surgery will not be allowed to drive home.   Please read over the following fact sheets that you were given:     __X__ Take these medicines the morning of surgery with A SIP OF WATER:  1. DULoxetine (CYMBALTA) 60 MG capsule  2.   3.   4.  5.  6.  ____ Fleet Enema (as directed)   ____ Use CHG Soap/SAGE wipes as directed  ____ Use inhalers on the day of surgery  ____ Stop metformin/Janumet/Farxiga 2 days prior to surgery    ____ Take 1/2 of usual insulin dose the night before surgery. No insulin the morning          of surgery.   ____ Stop Blood Thinners Coumadin/Plavix/Xarelto/Pleta/Pradaxa/Eliquis/Effient/Aspirin  on   Or contact your Surgeon, Cardiologist or Medical Doctor regarding  ability to stop your blood thinners  __X__ Stop Anti-inflammatories 7 days before surgery such as Advil, Ibuprofen, Motrin,  BC or Goodies Powder, Naprosyn, Naproxen, Aleve, Aspirin    __X__ Stop all herbal supplements, fish oil or vitamin E until after surgery. Stop today 08/26/21   ____ Bring C-Pap to the hospital.    You can shower with Dial "Gold" bar soap daily and the morning of surgery.    How to Use an Incentive Spirometer An incentive spirometer is a tool that measures how well you are filling your lungs with each breath. Learning to take long, deep breaths using this tool can help you keep your lungs clear and active. This may help to reverse or lessen your chance of developing breathing (pulmonary) problems, especially infection. You may be asked to use a spirometer: After a  surgery. If you have a lung problem or a history of smoking. After a long period of time when you have been unable to move or be active. If the spirometer includes an indicator to show the highest number that you have reached, your health care provider or respiratory therapist will help you set a goal. Keep a log of your progress as told by your health care provider. What are the risks? Breathing too quickly may cause dizziness or cause you to pass out. Take your time so you do not get dizzy or light-headed. If you are in pain, you may need to take pain medicine before doing incentive spirometry. It is harder to take a deep breath if you are having pain. How to use your incentive spirometer  Sit up on the edge of your bed or on a chair. Hold the incentive spirometer so that it is in an upright position. Before you use the spirometer, breathe out normally. Place the mouthpiece in your mouth. Make sure your lips are closed tightly around it. Breathe in slowly and as deeply as you can through your mouth, causing the piston or the ball to rise toward the top of the chamber. Hold your breath for 3-5 seconds, or for as long as possible. If the spirometer includes a coach indicator, use this to guide you in breathing. Slow down your breathing if the indicator goes above the marked areas. Remove the mouthpiece from your mouth and breathe out normally. The piston or ball will return to the bottom of the chamber. Rest for a few seconds, then repeat the steps 10 or more times. Take your time and take a few normal breaths between deep breaths so that you do not get dizzy or light-headed. Do this every 1-2 hours when you are awake. If the spirometer includes a goal marker to show the highest number you have reached (best effort), use this as a goal to work toward during each repetition. After each set of 10 deep breaths, cough a few times. This will help to make sure that your  lungs are clear. If you have  an incision on your chest or abdomen from surgery, place a pillow or a rolled-up towel firmly against the incision when you cough. This can help to reduce pain while taking deep breaths and coughing. General tips When you are able to get out of bed: Walk around often. Continue to take deep breaths and cough in order to clear your lungs. Keep using the incentive spirometer until your health care provider says it is okay to stop using it. If you have been in the hospital, you may be told to keep using the spirometer at home. Contact a health care provider if: You are having difficulty using the spirometer. You have trouble using the spirometer as often as instructed. Your pain medicine is not giving enough relief for you to use the spirometer as told. You have a fever. Get help right away if: You develop shortness of breath. You develop a cough with bloody mucus from the lungs. You have fluid or blood coming from an incision site after you cough. Summary An incentive spirometer is a tool that can help you learn to take long, deep breaths to keep your lungs clear and active. You may be asked to use a spirometer after a surgery, if you have a lung problem or a history of smoking, or if you have been inactive for a long period of time. Use your incentive spirometer as instructed every 1-2 hours while you are awake. If you have an incision on your chest or abdomen, place a pillow or a rolled-up towel firmly against your incision when you cough. This will help to reduce pain. Get help right away if you have shortness of breath, you cough up bloody mucus, or blood comes from your incision when you cough. This information is not intended to replace advice given to you by your health care provider. Make sure you discuss any questions you have with your health care provider. Document Revised: 10/20/2019 Document Reviewed: 10/20/2019 Elsevier Patient Education  [place].

## 2021-08-31 MED ORDER — CHLORHEXIDINE GLUCONATE 0.12 % MT SOLN: 15.0000 mL | Freq: Once | OROMUCOSAL | Status: AC

## 2021-08-31 MED ORDER — CEFAZOLIN SODIUM-DEXTROSE 2-4 GM/100ML-% IV SOLN
2.0000 g | INTRAVENOUS | Status: AC
  Administered 2021-09-01: 2 g via INTRAVENOUS

## 2021-08-31 MED ORDER — FAMOTIDINE 20 MG PO TABS: 20.0000 mg | ORAL_TABLET | Freq: Once | ORAL | Status: AC

## 2021-08-31 MED ORDER — ORAL CARE MOUTH RINSE: 15.0000 mL | Freq: Once | OROMUCOSAL | Status: AC

## 2021-08-31 MED ORDER — LACTATED RINGERS IV SOLN: INTRAVENOUS | Status: DC

## 2021-09-01 ENCOUNTER — Encounter: Payer: Self-pay | Admitting: Surgery

## 2021-09-01 ENCOUNTER — Ambulatory Visit: Payer: BC Managed Care – PPO | Admitting: Certified Registered Nurse Anesthetist

## 2021-09-01 ENCOUNTER — Encounter
Admission: RE | Disposition: A | Discharge status: Home / Self Care | Payer: Self-pay | Source: Home / Self Care | Attending: Surgery

## 2021-09-01 ENCOUNTER — Other Ambulatory Visit: Payer: Self-pay

## 2021-09-01 ENCOUNTER — Ambulatory Visit
Admission: RE | Admit: 2021-09-01 | Discharge: 2021-09-01 | Disposition: A | Discharge status: Home / Self Care | Payer: BC Managed Care – PPO | Attending: Surgery | Admitting: Surgery

## 2021-09-01 ENCOUNTER — Ambulatory Visit: Payer: BC Managed Care – PPO

## 2021-09-01 DIAGNOSIS — S46211A Strain of muscle, fascia and tendon of other parts of biceps, right arm, initial encounter: Secondary | ICD-10-CM | POA: Insufficient documentation

## 2021-09-01 DIAGNOSIS — X58XXXA Exposure to other specified factors, initial encounter: Secondary | ICD-10-CM | POA: Diagnosis not present

## 2021-09-01 HISTORY — PX: DISTAL BICEPS TENDON REPAIR: SHX1461

## 2021-09-01 SURGERY — REPAIR, TENDON, BICEPS, DISTAL
Anesthesia: General | Site: Elbow | Laterality: Right

## 2021-09-01 MED ORDER — ONDANSETRON HCL 4 MG/2ML IJ SOLN: 4.0000 mg | Freq: Once | INTRAMUSCULAR | Status: DC | PRN

## 2021-09-01 MED ORDER — ONDANSETRON HCL 4 MG PO TABS: 4.0000 mg | ORAL_TABLET | Freq: Four times a day (QID) | ORAL | Status: DC | PRN

## 2021-09-01 MED ORDER — FAMOTIDINE 20 MG PO TABS
ORAL_TABLET | ORAL | Status: AC
  Administered 2021-09-01: 20 mg via ORAL
  Filled 2021-09-01: qty 1

## 2021-09-01 MED ORDER — MIDAZOLAM HCL 2 MG/2ML IJ SOLN
INTRAMUSCULAR | Status: DC | PRN
  Administered 2021-09-01: 4 mg via INTRAVENOUS

## 2021-09-01 MED ORDER — FENTANYL CITRATE (PF) 100 MCG/2ML IJ SOLN
25.0000 ug | INTRAMUSCULAR | Status: DC | PRN
  Administered 2021-09-01 (×3): 25 ug via INTRAVENOUS

## 2021-09-01 MED ORDER — MIDAZOLAM HCL 2 MG/2ML IJ SOLN
INTRAMUSCULAR | Status: AC
  Filled 2021-09-01: qty 2

## 2021-09-01 MED ORDER — HYDROCODONE-ACETAMINOPHEN 5-325 MG PO TABS
1.0000 | ORAL_TABLET | ORAL | Status: DC | PRN
  Administered 2021-09-01: 2 via ORAL

## 2021-09-01 MED ORDER — SODIUM CHLORIDE 0.9 % IV SOLN: INTRAVENOUS | Status: DC

## 2021-09-01 MED ORDER — METOCLOPRAMIDE HCL 10 MG PO TABS: 5.0000 mg | ORAL_TABLET | Freq: Three times a day (TID) | ORAL | Status: DC | PRN

## 2021-09-01 MED ORDER — FENTANYL CITRATE (PF) 100 MCG/2ML IJ SOLN
INTRAMUSCULAR | Status: AC
  Filled 2021-09-01: qty 2

## 2021-09-01 MED ORDER — 0.9 % SODIUM CHLORIDE (POUR BTL) OPTIME
TOPICAL | Status: DC | PRN
  Administered 2021-09-01: 500 mL

## 2021-09-01 MED ORDER — BUPIVACAINE-EPINEPHRINE 0.5% -1:200000 IJ SOLN
INTRAMUSCULAR | Status: DC | PRN
  Administered 2021-09-01: 10 mL

## 2021-09-01 MED ORDER — DEXMEDETOMIDINE HCL IN NACL 200 MCG/50ML IV SOLN
INTRAVENOUS | Status: DC | PRN
  Administered 2021-09-01 (×3): 8 ug via INTRAVENOUS

## 2021-09-01 MED ORDER — ONDANSETRON HCL 4 MG/2ML IJ SOLN: 4.0000 mg | Freq: Four times a day (QID) | INTRAMUSCULAR | Status: DC | PRN

## 2021-09-01 MED ORDER — CHLORHEXIDINE GLUCONATE 0.12 % MT SOLN
OROMUCOSAL | Status: AC
  Administered 2021-09-01: 15 mL via OROMUCOSAL
  Filled 2021-09-01: qty 15

## 2021-09-01 MED ORDER — CEFAZOLIN SODIUM-DEXTROSE 2-4 GM/100ML-% IV SOLN
INTRAVENOUS | Status: AC
  Filled 2021-09-01: qty 100

## 2021-09-01 MED ORDER — ACETAMINOPHEN 10 MG/ML IV SOLN
INTRAVENOUS | Status: AC
  Filled 2021-09-01: qty 100

## 2021-09-01 MED ORDER — METOCLOPRAMIDE HCL 5 MG/ML IJ SOLN: 5.0000 mg | Freq: Three times a day (TID) | INTRAMUSCULAR | Status: DC | PRN

## 2021-09-01 MED ORDER — PROPOFOL 500 MG/50ML IV EMUL
INTRAVENOUS | Status: AC
  Filled 2021-09-01: qty 100

## 2021-09-01 MED ORDER — ONDANSETRON HCL 4 MG/2ML IJ SOLN
INTRAMUSCULAR | Status: DC | PRN
  Administered 2021-09-01: 4 mg via INTRAVENOUS

## 2021-09-01 MED ORDER — ACETAMINOPHEN 10 MG/ML IV SOLN
INTRAVENOUS | Status: DC | PRN
  Administered 2021-09-01: 1000 mg via INTRAVENOUS

## 2021-09-01 MED ORDER — PROPOFOL 10 MG/ML IV BOLUS
INTRAVENOUS | Status: DC | PRN
  Administered 2021-09-01 (×2): 30 mg via INTRAVENOUS
  Administered 2021-09-01: 220 mg via INTRAVENOUS

## 2021-09-01 MED ORDER — HYDROCODONE-ACETAMINOPHEN 5-325 MG PO TABS
ORAL_TABLET | ORAL | Status: AC
  Filled 2021-09-01: qty 2

## 2021-09-01 MED ORDER — BUPIVACAINE-EPINEPHRINE (PF) 0.5% -1:200000 IJ SOLN
INTRAMUSCULAR | Status: AC
  Filled 2021-09-01: qty 30

## 2021-09-01 MED ORDER — KETOROLAC TROMETHAMINE 30 MG/ML IJ SOLN
INTRAMUSCULAR | Status: AC
  Administered 2021-09-01: 30 mg via INTRAVENOUS
  Filled 2021-09-01: qty 1

## 2021-09-01 MED ORDER — FENTANYL CITRATE (PF) 100 MCG/2ML IJ SOLN
INTRAMUSCULAR | Status: DC | PRN
  Administered 2021-09-01 (×3): 50 ug via INTRAVENOUS

## 2021-09-01 MED ORDER — FENTANYL CITRATE (PF) 100 MCG/2ML IJ SOLN
INTRAMUSCULAR | Status: AC
  Administered 2021-09-01: 25 ug via INTRAVENOUS
  Filled 2021-09-01: qty 2

## 2021-09-01 MED ORDER — PROPOFOL 10 MG/ML IV BOLUS
INTRAVENOUS | Status: AC
  Filled 2021-09-01: qty 20

## 2021-09-01 MED ORDER — HYDROCODONE-ACETAMINOPHEN 5-325 MG PO TABS: 1.0000 | ORAL_TABLET | Freq: Four times a day (QID) | ORAL | 0 refills | Status: AC | PRN

## 2021-09-01 MED ORDER — LIDOCAINE HCL (CARDIAC) PF 100 MG/5ML IV SOSY
PREFILLED_SYRINGE | INTRAVENOUS | Status: DC | PRN
  Administered 2021-09-01: 80 mg via INTRAVENOUS

## 2021-09-01 MED ORDER — KETOROLAC TROMETHAMINE 30 MG/ML IJ SOLN
INTRAMUSCULAR | Status: DC | PRN
  Administered 2021-09-01: 30 mg via INTRAVENOUS

## 2021-09-01 MED ORDER — KETOROLAC TROMETHAMINE 30 MG/ML IJ SOLN
30.0000 mg | Freq: Once | INTRAMUSCULAR | Status: AC
Start: 2021-09-01 — End: 2021-09-01

## 2021-09-01 MED ORDER — DEXAMETHASONE SODIUM PHOSPHATE 10 MG/ML IJ SOLN
INTRAMUSCULAR | Status: DC | PRN
  Administered 2021-09-01: 10 mg via INTRAVENOUS

## 2021-09-01 SURGICAL SUPPLY — 41 items
ADH LQ OCL WTPRF AMP STRL LF (MISCELLANEOUS) ×1
ADHESIVE MASTISOL STRL (MISCELLANEOUS) ×1 IMPLANT
APL PRP STRL LF DISP 70% ISPRP (MISCELLANEOUS) ×2
BNDG COHESIVE 4X5 TAN ST LF (GAUZE/BANDAGES/DRESSINGS) ×2 IMPLANT
BNDG ELASTIC 4X5.8 VLCR STR LF (GAUZE/BANDAGES/DRESSINGS) ×2 IMPLANT
BNDG ESMARK 4X12 TAN STRL LF (GAUZE/BANDAGES/DRESSINGS) ×2 IMPLANT
CHLORAPREP W/TINT 26 (MISCELLANEOUS) ×4 IMPLANT
DRAPE FLUOR MINI C-ARM 54X84 (DRAPES) ×2 IMPLANT
DRAPE SURG 17X11 SM STRL (DRAPES) ×2 IMPLANT
DRAPE U-SHAPE 47X51 STRL (DRAPES) ×2 IMPLANT
ELECT REM PT RETURN 9FT ADLT (ELECTROSURGICAL) ×2
ELECTRODE REM PT RTRN 9FT ADLT (ELECTROSURGICAL) ×1 IMPLANT
GAUZE 4X4 16PLY ~~LOC~~+RFID DBL (SPONGE) ×2 IMPLANT
GAUZE SPONGE 4X4 12PLY STRL (GAUZE/BANDAGES/DRESSINGS) ×2 IMPLANT
GAUZE XEROFORM 1X8 LF (GAUZE/BANDAGES/DRESSINGS) ×2 IMPLANT
GLOVE SURG ENC MOIS LTX SZ8 (GLOVE) ×4 IMPLANT
GLOVE SURG UNDER LTX SZ8 (GLOVE) ×2 IMPLANT
GOWN STRL REUS W/ TWL LRG LVL3 (GOWN DISPOSABLE) ×2 IMPLANT
GOWN STRL REUS W/TWL LRG LVL3 (GOWN DISPOSABLE) ×4
IMPL TOGGLELOC ELBOW SYSTEM (Orthopedic Implant) ×1 IMPLANT
IMPLANT TOGGLELOC ELBOW SYSTEM (Orthopedic Implant) ×2 IMPLANT
KIT TURNOVER KIT A (KITS) ×2 IMPLANT
MANIFOLD NEPTUNE II (INSTRUMENTS) ×2 IMPLANT
NS IRRIG 500ML POUR BTL (IV SOLUTION) ×2 IMPLANT
PACK EXTREMITY ARMC (MISCELLANEOUS) ×2 IMPLANT
PAD ABD DERMACEA PRESS 5X9 (GAUZE/BANDAGES/DRESSINGS) ×1 IMPLANT
PAD CAST 4YDX4 CTTN HI CHSV (CAST SUPPLIES) IMPLANT
PAD CAST CTTN 4X4 STRL (SOFTGOODS) ×1 IMPLANT
PADDING CAST 4IN STRL (MISCELLANEOUS) ×2
PADDING CAST BLEND 4X4 STRL (MISCELLANEOUS) ×2 IMPLANT
PADDING CAST COTTON 4X4 STRL (CAST SUPPLIES) ×6
PADDING CAST COTTON 4X4 STRL (SOFTGOODS) ×2
SLING ARM LRG DEEP (SOFTGOODS) ×2 IMPLANT
SPLINT CAST 1 STEP 4X30 (MISCELLANEOUS) ×1 IMPLANT
STOCKINETTE IMPERV 14X48 (MISCELLANEOUS) ×2 IMPLANT
STRIP CLOSURE SKIN 1/4X4 (GAUZE/BANDAGES/DRESSINGS) ×2 IMPLANT
SUT BRDBAND LOOP ST-NDL BL (SUTURE) ×1 IMPLANT
SUT VIC AB 2-0 SH 27 (SUTURE) ×2
SUT VIC AB 2-0 SH 27XBRD (SUTURE) ×1 IMPLANT
SUT VIC AB 3-0 SH 27 (SUTURE) ×2
SUT VIC AB 3-0 SH 27X BRD (SUTURE) ×1 IMPLANT

## 2021-09-01 NOTE — Transfer of Care (Signed)
Immediate Anesthesia Transfer of Care Note  Patient: Danny Russell  Procedure(s) Performed: DISTAL BICEPS TENDON REPAIR (Right: Elbow)  Patient Location: PACU  Anesthesia Type:General  Level of Consciousness: awake, drowsy and patient cooperative  Airway & Oxygen Therapy: Patient Spontanous Breathing and Patient connected to face mask oxygen  Post-op Assessment: Report given to RN and Post -op Vital signs reviewed and stable  Post vital signs: Reviewed and stable  Last Vitals:  Vitals Value Taken Time  BP 132/86 09/01/21 1502  Temp 36.5 C 09/01/21 1501  Pulse 81 09/01/21 1506  Resp 27 09/01/21 1506  SpO2 99 % 09/01/21 1506  Vitals shown include unvalidated device data.  Last Pain:  Vitals:   09/01/21 1146  TempSrc: Temporal  PainSc: 2          Complications: No notable events documented.

## 2021-09-01 NOTE — Discharge Instructions (Addendum)
Orthopedic discharge instructions: Keep splint dry and intact. Keep hand elevated above heart level. Apply ice to affected area frequently. Take ibuprofen 600-800 mg TID with meals for 7-10 days, then as necessary. Take pain medication as prescribed or ES Tylenol when needed.  Return for follow-up in 10-14 days or as scheduled.  AMBULATORY SURGERY  DISCHARGE INSTRUCTIONS   The drugs that you were given will stay in your system until tomorrow so for the next 24 hours you should not:  Drive an automobile Make any legal decisions Drink any alcoholic beverage   You may resume regular meals tomorrow.  Today it is better to start with liquids and gradually work up to solid foods.  You may eat anything you prefer, but it is better to start with liquids, then soup and crackers, and gradually work up to solid foods.   Please notify your doctor immediately if you have any unusual bleeding, trouble breathing, redness and pain at the surgery site, drainage, fever, or pain not relieved by medication.    Additional Instructions:        Please contact your physician with any problems or Same Day Surgery at [phone], Monday through Friday 6 am to 4 pm, or [place] at [facility] number at [phone].

## 2021-09-01 NOTE — H&P (Signed)
History of Present Illness: Danny Russell is a 36 y.o. male who presents today for evaluation of ongoing right elbow pain since suffering an injury on Saturday. The patient was throwing a heavy stick above his head when he felt a pop and a crack sensation in the right elbow. The patient had immediate pain and had significant limitations with activity and motion at the time of the injury. The patient did go to the walk-in clinic where x-rays of the right elbow were negative for acute fracture or evidence of dislocation. The patient was prescribed naproxen which she has begun to take. Overall he feels that the pain in his elbow has improved significantly since Saturday however he does still report discomfort especially in the antecubital fossa of the right elbow at the area of the distal biceps tendon attachment site. The patient is right-hand dominant. He denies any surgical history to the right elbow. He denies any numbness or ting into the right upper extremity at this time. He is reporting a aching discomfort especially at the distal biceps tendon. He has not noticed any significant deformity to the right elbow. No open wound. No repeat trauma or injury. He does report an occasional catching sensation with certain movements of the right elbow but does feel that it is improving. He presents today for further evaluation.  Past Medical History:  Anxiety disorder   Anxiety, mild 11/28/2016   Benign prostatic hyperplasia with urinary frequency 11/28/2016    Depression   Dyspepsia 11/28/2016 with prior endoscopy   GERD (gastroesophageal reflux disease)   Other hyperlipidemia 11/28/2016   Past Surgical History:  VASECTOMY 04/2013   INGUINAL HERNIA REPAIR Bilateral 11/03/20 (Dr Peyton Najjar - [facility])   Past Family History:  Reflux disease Mother   Hypothyroidism Mother   No Known Problems Father   Medications:  DULoxetine (CYMBALTA) 30 MG DR capsule Take 30 mg by mouth once daily. 4   naproxen (NAPROSYN)  500 MG tablet Take 1 tablet (500 mg total) by mouth 2 (two) times daily with meals for 30 days 60 tablet 0   omega-3/dha/epa/fish oil (FISH OIL HIGH POTENCY ORAL) Take 1 capsule by mouth once daily   omeprazole-sodium bicarbonate (ZEGERID) 40-1.1 mg-gram capsule Take 1 capsule by mouth every morning before breakfast.   Allergies:  Augmentin [Amoxicillin-Pot Clavulanate] Other (GI upset)   Bioxin causes GI upset   Sudafed [Pseudoephedrine Hcl] Other ("makes me feel weird")   Review of Systems:  A comprehensive 14 point ROS was performed, reviewed by me today, and the pertinent orthopaedic findings are documented in the HPI.  Physical Exam: BP 120/82   Ht 182.9 cm (6')   Wt (!) 101.1 kg (222 lb 12.8 oz)   BMI 30.22 kg/m  General/Constitutional: The patient appears to be well-nourished, well-developed, and in no acute distress. Neuro/Psych: Normal mood and affect, oriented to person, place and time. Eyes: Non-icteric. Pupils are equal, round, and reactive to light, and exhibit synchronous movement. ENT: Unremarkable. Lymphatic: No palpable adenopathy. Respiratory: Lungs clear to auscultation, Normal chest excursion, No wheezes and Non-labored breathing Cardiovascular: Regular rate and rhythm. No murmurs. and No edema, swelling or tenderness, except as noted in detailed exam. Integumentary: No impressive skin lesions present, except as noted in detailed exam. Musculoskeletal: Unremarkable, except as noted in detailed exam.  Right elbow exam: Skin examination of the right elbow demonstrates no open wound, erythema. There does not appear to be any ecchymosis as well. He is able to completely extend at the right elbow  with increased pain at the extremes, elbow flexion up to 115 degrees with increased discomfort. The distal biceps tendon is tender to palpation. The patient does have a negative hook test to the right elbow. The patient is able to supinate and pronate the right forearm however he  does have increased discomfort with supination pronation. He does have intact strength with resisted elbow flexion in addition to resisted supination pronation. He is intact to light touch throughout the right upper extremity. Cap refills intact each individual digits of the right hand. Radial pulses intact to the right wrist. He is able to make a full composite fist. Nontender palpation over the lateral or medial epicondyles, nontender palpation over the olecranon process.  Imaging: X-rays were obtained at the walk-in clinic, these x-rays did not demonstrate any evidence of acute fracture, dislocation. No joint effusion can be identified.  Impression: Rupture of right distal biceps tendon.  Plan:  1. Treatment options were discussed today with the patient. 2. The patient suffered a injury to the right elbow felt a pop at the time of lifting a heavy stick and throwing it across his body above his head. 3. Injury history is concerning for possible distal biceps rupture. The distal biceps tendon can be palpated however today's visit. 4. Possible that the patient suffered a partial-thickness tear of the distal biceps tendon. The patient will undergo an MRI scan of the right elbow for further evaluation. 5. Continue with naproxen as needed for discomfort at this time. 6. The patient will follow-up after he has undergone his right elbow MRI scan. They can call the clinic they have any questions, new symptoms develop or symptoms worsen.  Addendum 08/23/21: The patient underwent his right elbow MRI scan. MRI scan of the right elbow demonstrates a high-grade near complete tear of the right distal biceps tendon at its insertion point along the radial tuberosity. The patient was contacted and instructed on the imaging results. After discussion of the risk and benefits of both conservative and aggressive treatment options, the patient would like to proceed with a repair of right distal biceps tendon rupture.  The risk including infection, nerve damage, repeat tear, stiffness, blood clot, stroke, pneumonia, etc. were all discussed in detail with the patient. The patient would like to proceed with surgery with Dr. Roland Rack on 09/01/2021. This document will serve as a surgical history and physical.   H&P reviewed and patient re-examined. No changes.

## 2021-09-01 NOTE — Anesthesia Procedure Notes (Signed)
Procedure Name: LMA Insertion Date/Time: 09/01/2021 1:14 PM Performed by: Lowry Bowl, CRNA Pre-anesthesia Checklist: Patient identified, Suction available, Emergency Drugs available and Patient being monitored Patient Re-evaluated:Patient Re-evaluated prior to induction Oxygen Delivery Method: Circle system utilized Preoxygenation: Pre-oxygenation with 100% oxygen Induction Type: IV induction LMA: LMA inserted LMA Size: 5.0 Number of attempts: 1 Placement Confirmation: positive ETCO2 and breath sounds checked- equal and bilateral Tube secured with: Tape Dental Injury: Teeth and Oropharynx as per pre-operative assessment

## 2021-09-01 NOTE — Op Note (Signed)
09/01/2021  3:10 PM  Patient:   Danny Russell  Pre-Op Diagnosis:   Distal biceps tendon rupture, right elbow.  Post-Op Diagnosis:   Same.  Procedure:   Primary repair of ruptured distal biceps tendon, right elbow.  Surgeon:   Pascal Lux, MD  Assistant:   None  Anesthesia:   General LMA  Findings:   As above.  Complications:   None  EBL:   5 cc  Fluids:   1000 cc crystalloid  TT:   73 minutes at 250 mmHg  Drains:   None  Closure:   2-0 and 3-0 Vicryl subcuticular sutures  Implants:   Biomet ToggleLoc 1  Brief Clinical Note:   The patient is a 36 year old male who sustained the above-noted injury 1 week ago when he felt a pop in the anterior aspect of his upper arm and throwing a large branch in his yard.  Subsequent work-up, including an MRI scan, confirmed the presence of an acute rupture of his right distal biceps.  He presents at this time for formal repair of the acute right distal biceps tendon rupture.  Procedure:   The patient was brought into the operating room and lain in the supine position. After adequate general laryngeal mask anesthesia was achieved, the right upper extremity and hand were prepped with ChloraPrep solution before being draped sterilely. Preoperative antibiotics were administered.  A timeout was performed to verify the appropriate surgical site before the limb was exsanguinated with an Esmarch and the tourniquet inflated to 250 mmHg.   A 6-7 centimeter L-shaped incision was made and extended distally from the elbow flexion crease. The incision was carried down through the subcutaneous cutaneous tissues with care taken to identify and protect the neurovascular structures in the area. Using blunt dissection, the distal biceps tendon was identified. Initially, the biceps tendon could not be identified so a short transverse incision was made more proximally thinking that the tendon had retracted. However, the tendon finally was located through the  original incision. The lacertus fibrosis was still intact, preventing proximal migration of the tendon, so it was readily identified. The distal portion of the tendon was debrided of degenerative tissues before a #2 OrthoSorb suture was woven through the tendon. The loop of the Biomet ToggleLoc device was then secured to the distal end of the tendon by tying the ends of this #2 suture tightly.   Attention was directed distally. The bicipital sheath was followed down to the proximal radius using blunt dissection. With care, the bicipital tuberosity was identified by palpation and then dissected free of adjacent soft tissue. While holding the forearm in maximal supination, a guidewire was placed and its position verified using FluoroScan imaging in AP and lateral projections. The guidewire was overreamed with an 8 mm acorn reamer through the anterior cortex before the posterior cortex was overreamed with a 4.5 mm reamer. The ToggleLoc anchor was pushed through the holes drilled into the bicipital tuberosity before it was deployed. The adequacy of fixation of the ToggleLoc anchor was verified by pulling strongly on the sutures. The tendon was cinched into the bony socket with the elbow flexed to 90. The adequacy of anchor position was verified fluoroscopically in AP and lateral projections and found to be excellent. The ToggleLoc sutures were then cut and removed. The elbow was ranged passively and the repair was deemed to be stable to within 20 of extension.  The wound was copiously irrigated with sterile saline solution before the more distal wound  was closed with 2-0 Vicryl subcuticular sutures. The more proximal incision was closed using 3-0 Vicryl subcuticular sutures before Benzoin and steri-strips were applied to the skin. A total of 10 cc of 0.5% Sensorcaine with epinephrine was injected in and around the incision sites. A sterile bulky dressing was applied to the elbow before the arm was placed into a  posterior splint maintaining the elbow at approximately 90 of flexion and in neutral rotation. The patient was then awakened, extubated, and returned to the recovery room in satisfactory condition after tolerating the procedure well.

## 2021-09-01 NOTE — Anesthesia Preprocedure Evaluation (Signed)
Anesthesia Evaluation  Patient identified by MRN, date of birth, ID band Patient awake    Reviewed: Allergy & Precautions, H&P , NPO status , Patient's Chart, lab work & pertinent test results, reviewed documented beta blocker date and time   Airway Mallampati: II  TM Distance: >3 FB Neck ROM: full    Dental  (+) Teeth Intact   Pulmonary neg pulmonary ROS, former smoker,    Pulmonary exam normal        Cardiovascular Exercise Tolerance: Good negative cardio ROS Normal cardiovascular exam Rhythm:regular Rate:Normal     Neuro/Psych negative neurological ROS  negative psych ROS   GI/Hepatic Neg liver ROS, GERD  Medicated,  Endo/Other  negative endocrine ROS  Renal/GU negative Renal ROS  negative genitourinary   Musculoskeletal   Abdominal   Peds  Hematology negative hematology ROS (+)   Anesthesia Other Findings Past Medical History: No date: Depression No date: GERD (gastroesophageal reflux disease) 2014: Prostatitis Past Surgical History: No date: ESOPHAGOGASTRODUODENOSCOPY No date: VASECTOMY 2020: WRIST SURGERY; Right     Comment:  cyst  11/03/2020: XI ROBOTIC ASSISTED INGUINAL HERNIA REPAIR WITH MESH;  Bilateral     Comment:  Procedure: XI ROBOTIC ASSISTED INGUINAL HERNIA REPAIR               WITH MESH;  Surgeon: Herbert Pun, MD;                Location: [facility] ORS;  Service: General;  Laterality:               Bilateral; BMI    Body Mass Index: 29.84 kg/m     Reproductive/Obstetrics negative OB ROS                             Anesthesia Physical Anesthesia Plan  ASA: 2  Anesthesia Plan: General ETT   Post-op Pain Management:    Induction:   PONV Risk Score and Plan:   Airway Management Planned:   Additional Equipment:   Intra-op Plan:   Post-operative Plan:   Informed Consent: I have reviewed the patients History and Physical, chart, labs and  discussed the procedure including the risks, benefits and alternatives for the proposed anesthesia with the patient or authorized representative who has indicated his/her understanding and acceptance.     Dental Advisory Given  Plan Discussed with: CRNA  Anesthesia Plan Comments:         Anesthesia Quick Evaluation

## 2021-09-02 ENCOUNTER — Encounter: Payer: Self-pay | Admitting: Surgery

## 2021-09-18 NOTE — Anesthesia Postprocedure Evaluation (Signed)
Anesthesia Post Note  Patient: Danny Russell  Procedure(s) Performed: DISTAL BICEPS TENDON REPAIR (Right: Elbow)  Patient location during evaluation: PACU Anesthesia Type: General Level of consciousness: awake and alert Pain management: pain level controlled Vital Signs Assessment: post-procedure vital signs reviewed and stable Respiratory status: spontaneous breathing, nonlabored ventilation, respiratory function stable and patient connected to nasal cannula oxygen Cardiovascular status: blood pressure returned to baseline and stable Postop Assessment: no apparent nausea or vomiting Anesthetic complications: no   No notable events documented.   Last Vitals:  Vitals:   09/01/21 1545 09/01/21 1554  BP: 127/79 (!) 143/98  Pulse: 64 73  Resp: 13 16  Temp:  (!) 36.1 C  SpO2: 97% 98%    Last Pain:  Vitals:   09/02/21 0836  TempSrc:   PainSc: [place]

## 2021-11-11 ENCOUNTER — Other Ambulatory Visit: Payer: Self-pay | Admitting: Surgery

## 2021-11-11 DIAGNOSIS — S46211D Strain of muscle, fascia and tendon of other parts of biceps, right arm, subsequent encounter: Secondary | ICD-10-CM

## 2021-11-15 ENCOUNTER — Ambulatory Visit
Admission: RE | Admit: 2021-11-15 | Discharge: 2021-11-15 | Disposition: A | Discharge status: Home / Self Care | Payer: BC Managed Care – PPO | Source: Ambulatory Visit | Attending: Surgery | Admitting: Surgery

## 2021-11-15 DIAGNOSIS — S46211D Strain of muscle, fascia and tendon of other parts of biceps, right arm, subsequent encounter: Secondary | ICD-10-CM | POA: Insufficient documentation

## 2021-11-15 IMAGING — MR MR ELBOW*R* W/O CM
5 series · 40 of 40 positions shown · non-contrast
Comparison: MR elbow [DATE]; X-ray elbow [DATE].

CLINICAL DATA: Right elbow pain with limited range of motion
related to a fall 2 weeks ago. History of right elbow surgery
[DATE] for distal biceps tear.

EXAM:
MRI OF THE RIGHT ELBOW WITHOUT CONTRAST
TECHNIQUE: Multiplanar, multisequence MR imaging of the elbow was performed. No
intravenous contrast was administered.

[Series 3: T1 · axial · right · 3.0mm · 0.51mm/px · z∈[-50,+44]mm · 8 of 25 slices shown (1 of 2)]
[im 1/25]
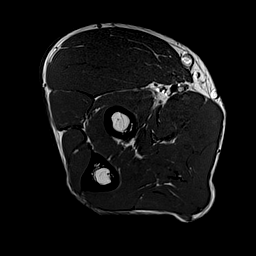
[im 4/25]
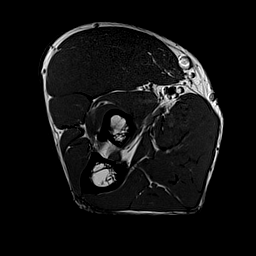
[im 7/25]
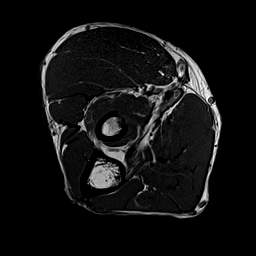
[im 11/25]
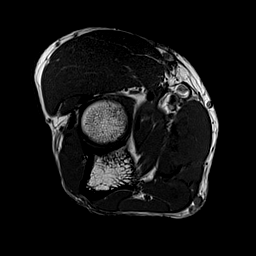
[im 14/25]
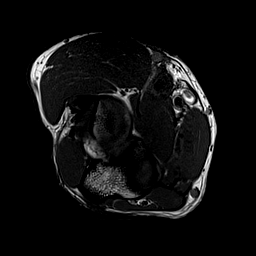
[im 18/25]
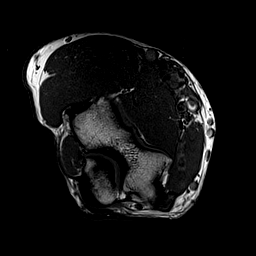
[im 21/25]
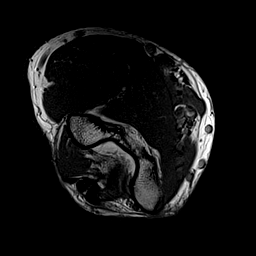
[im 25/25]
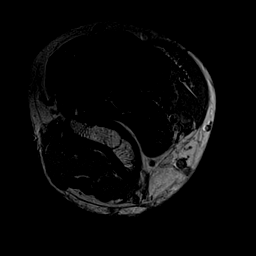

[Series 4: T2 fat-sat · axial · right · 3.0mm · 0.47mm/px · z∈[-49,+45]mm · 8 of 25 slices shown (1 of 3)]
[im 1/25]
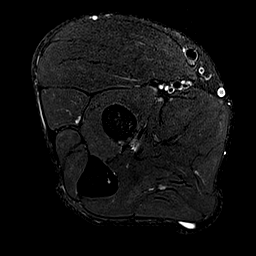
[im 4/25]
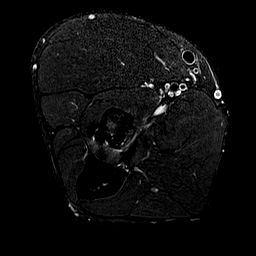
[im 7/25]
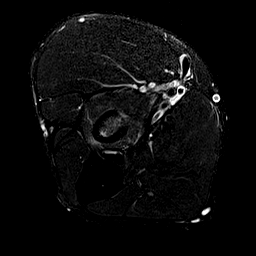
[im 11/25]
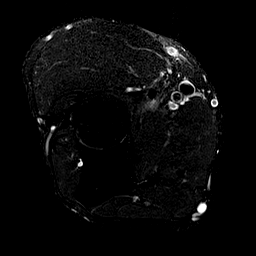
[im 14/25]
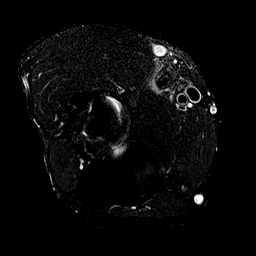
[im 18/25]
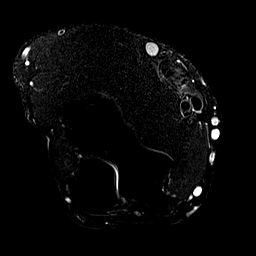
[im 21/25]
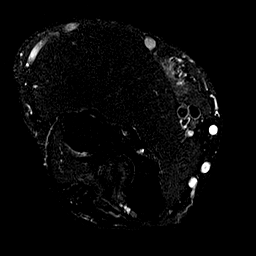
[im 25/25]
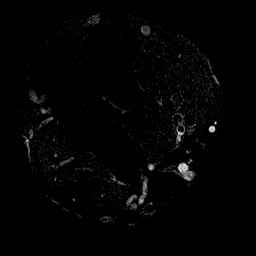

[Series 5: T2 fat-sat · oblique · right · 3.0mm · 0.55mm/px · 7 of 22 slices shown (2 of 3)]
[im 1/22]
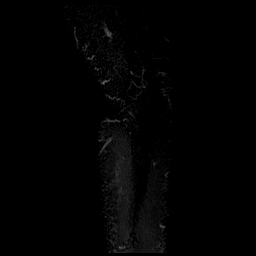
[im 4/22]
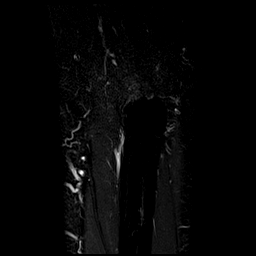
[im 8/22]
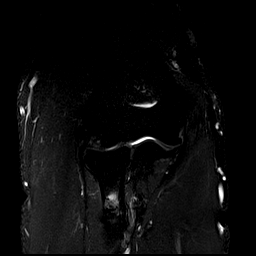
[im 11/22]
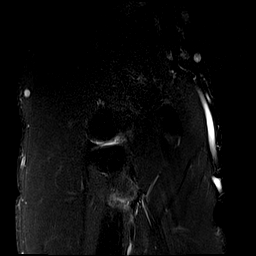
[im 15/22]
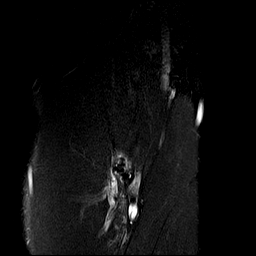
[im 18/22]
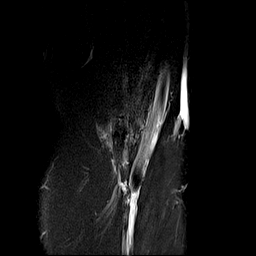
[im 22/22]
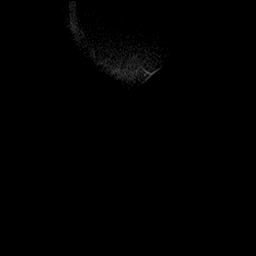

[Series 7: T2 fat-sat · oblique · right · 3.0mm · 0.55mm/px · 9 of 29 slices shown (3 of 3)]
[im 1/29]
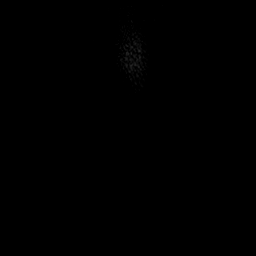
[im 4/29]
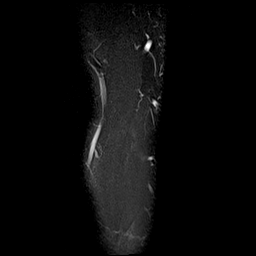
[im 8/29]
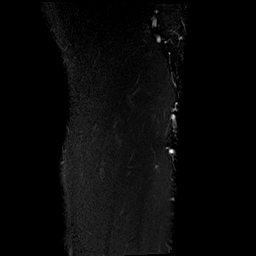
[im 11/29]
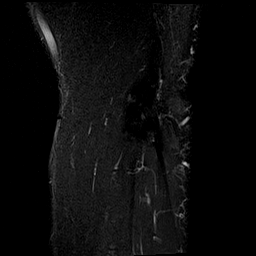
[im 15/29]
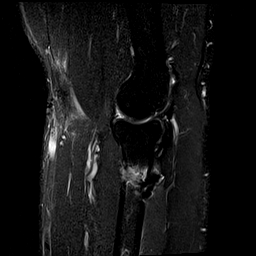
[im 18/29]
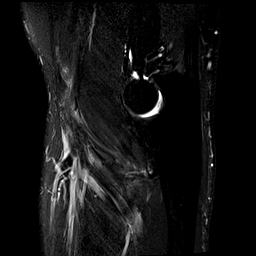
[im 22/29]
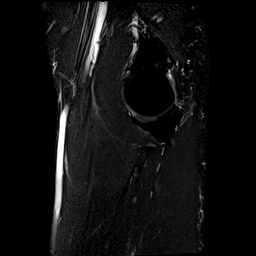
[im 25/29]
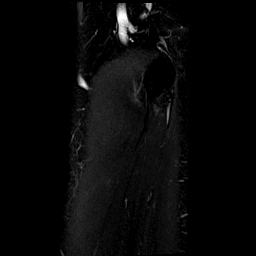
[im 29/29]
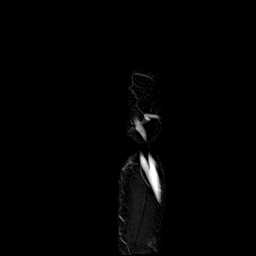

[Series 8: T1 · oblique · right · 3.0mm · 0.55mm/px · 8 of 25 slices shown (2 of 2)]
[im 1/25]
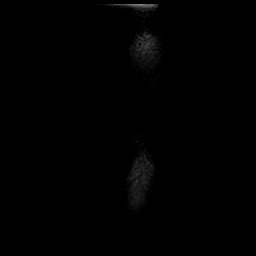
[im 4/25]
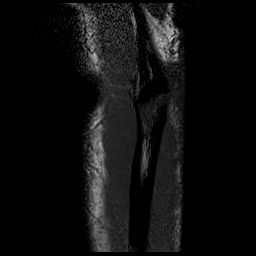
[im 7/25]
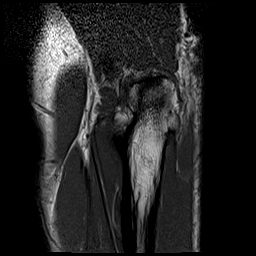
[im 11/25]
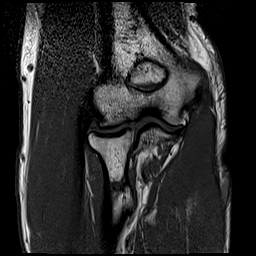
[im 14/25]
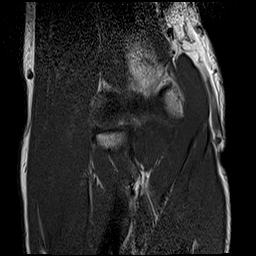
[im 18/25]
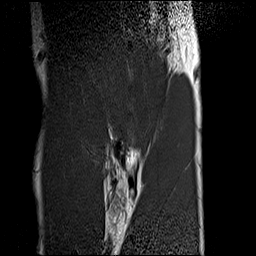
[im 21/25]
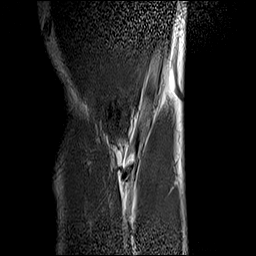
[im 25/25]
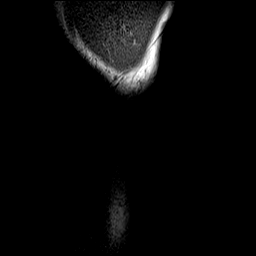

[40 of 40 positions shown; findings below may reference images not displayed]

FINDINGS: TENDONS

Common forearm flexor origin: Minimal proximal tendinopathy.

Common forearm extensor origin: Unremarkable

Biceps: Oblique signal extending through the myotendinous junction
of the distal biceps for example on images 5-9 of series [DATE]
reflect a component of partial tearing but much of this appearance
seems to be related to pulsation artifact from the brachial artery.
Edema and expansion of the distal biceps tendon noted along with
biceps tendon irregularity. Substantial thinning and somewhat
amorphous appearance of the distal biceps tendon as it approaches
the attachment tunnel adjacent to the radial tuberosity. I do not
see a retracted tendon with surrounding fluid so I assume that much
of this represents tendinopathy and chronic partial tearing,
although a could acute component of partial tearing is not readily
excluded. The distal biceps tendon is substantially thinned in
indistinct, making it difficult to assess its biomechanical
resilience.

Triceps: Unremarkable

LIGAMENTS

Medial stabilizers: Unremarkable

Lateral stabilizers:  Unremarkable

Cartilage: No chondral defect identified.

Joint: No joint effusion or free fragment.

Cubital tunnel: Unremarkable

Bones: Tunnel in the proximal radial diaphysis just proximal to the
radial tuberosity.
IMPRESSION: 1. Interval biceps repair with a tunnel in the proximal radius with
some metal artifact along the posterior margin of the tunnel and
presumed granulation tissue within the tunnel. The distal biceps
tendon is abnormal, with an amorphous irregularity just distal to
the myotendinous junction, low-grade surrounding edema (although
substantially less than that shown on [DATE]) and prominent
distal thinning and indistinctness of the biceps tendon compatible
with at least high-grade partial tearing. The attachment of the
biceps tendon to the tunnel and the adjacent radial tuberosity is
highly indistinct.

## 2023-01-10 ENCOUNTER — Other Ambulatory Visit: Payer: Self-pay | Admitting: Internal Medicine

## 2023-01-10 DIAGNOSIS — E7849 Other hyperlipidemia: Secondary | ICD-10-CM

## 2023-01-17 ENCOUNTER — Ambulatory Visit
Admission: RE | Admit: 2023-01-17 | Discharge: 2023-01-17 | Disposition: A | Discharge status: Home / Self Care | Payer: BC Managed Care – PPO | Source: Ambulatory Visit | Attending: Internal Medicine | Admitting: Internal Medicine

## 2023-01-17 DIAGNOSIS — E7849 Other hyperlipidemia: Secondary | ICD-10-CM

## 2024-03-26 ENCOUNTER — Other Ambulatory Visit: Payer: Self-pay

## 2024-03-26 DIAGNOSIS — Z1211 Encounter for screening for malignant neoplasm of colon: Secondary | ICD-10-CM
